# Patient Record
Sex: Female | Born: 1935 | Race: White | Hispanic: No | Smoking: Never smoker
Health system: Southern US, Community
[De-identification: ages and names within clinical notes are randomized; demographics above are authoritative.]

## PROBLEM LIST (undated history)

## (undated) DIAGNOSIS — N189 Chronic kidney disease, unspecified: Secondary | ICD-10-CM

## (undated) DIAGNOSIS — K219 Gastro-esophageal reflux disease without esophagitis: Secondary | ICD-10-CM

## (undated) DIAGNOSIS — R945 Abnormal results of liver function studies: Secondary | ICD-10-CM

## (undated) DIAGNOSIS — M109 Gout, unspecified: Secondary | ICD-10-CM

## (undated) DIAGNOSIS — E1140 Type 2 diabetes mellitus with diabetic neuropathy, unspecified: Secondary | ICD-10-CM

## (undated) DIAGNOSIS — E785 Hyperlipidemia, unspecified: Secondary | ICD-10-CM

## (undated) DIAGNOSIS — J189 Pneumonia, unspecified organism: Secondary | ICD-10-CM

## (undated) DIAGNOSIS — N390 Urinary tract infection, site not specified: Secondary | ICD-10-CM

## (undated) DIAGNOSIS — I509 Heart failure, unspecified: Secondary | ICD-10-CM

## (undated) DIAGNOSIS — M6282 Rhabdomyolysis: Secondary | ICD-10-CM

## (undated) DIAGNOSIS — E039 Hypothyroidism, unspecified: Secondary | ICD-10-CM

## (undated) DIAGNOSIS — I10 Essential (primary) hypertension: Secondary | ICD-10-CM

## (undated) HISTORY — PX: CHOLECYSTECTOMY: SHX55

---

## 2008-12-29 ENCOUNTER — Emergency Department (HOSPITAL_COMMUNITY): Admission: EM | Admit: 2008-12-29 | Discharge: 2008-12-29 | Payer: Self-pay | Admitting: Emergency Medicine

## 2009-01-14 ENCOUNTER — Ambulatory Visit: Payer: Self-pay | Admitting: Cardiology

## 2009-01-14 ENCOUNTER — Encounter (INDEPENDENT_AMBULATORY_CARE_PROVIDER_SITE_OTHER): Payer: Self-pay | Admitting: Internal Medicine

## 2009-01-14 ENCOUNTER — Inpatient Hospital Stay (HOSPITAL_COMMUNITY): Admission: EM | Admit: 2009-01-14 | Discharge: 2009-01-19 | Payer: Self-pay | Admitting: Emergency Medicine

## 2009-04-14 ENCOUNTER — Inpatient Hospital Stay (HOSPITAL_COMMUNITY): Admission: AD | Admit: 2009-04-14 | Discharge: 2009-04-18 | Payer: Self-pay

## 2009-09-24 ENCOUNTER — Ambulatory Visit (HOSPITAL_COMMUNITY): Admission: RE | Admit: 2009-09-24 | Discharge: 2009-09-24 | Payer: Self-pay | Admitting: Urology

## 2010-04-06 IMAGING — CR DG CHEST 2V
1 series · 1 of 1 positions shown · non-contrast
Comparison: 12/29/2008

CLINICAL DATA: Syncope

CHEST - 2 VIEW

[view not recorded]
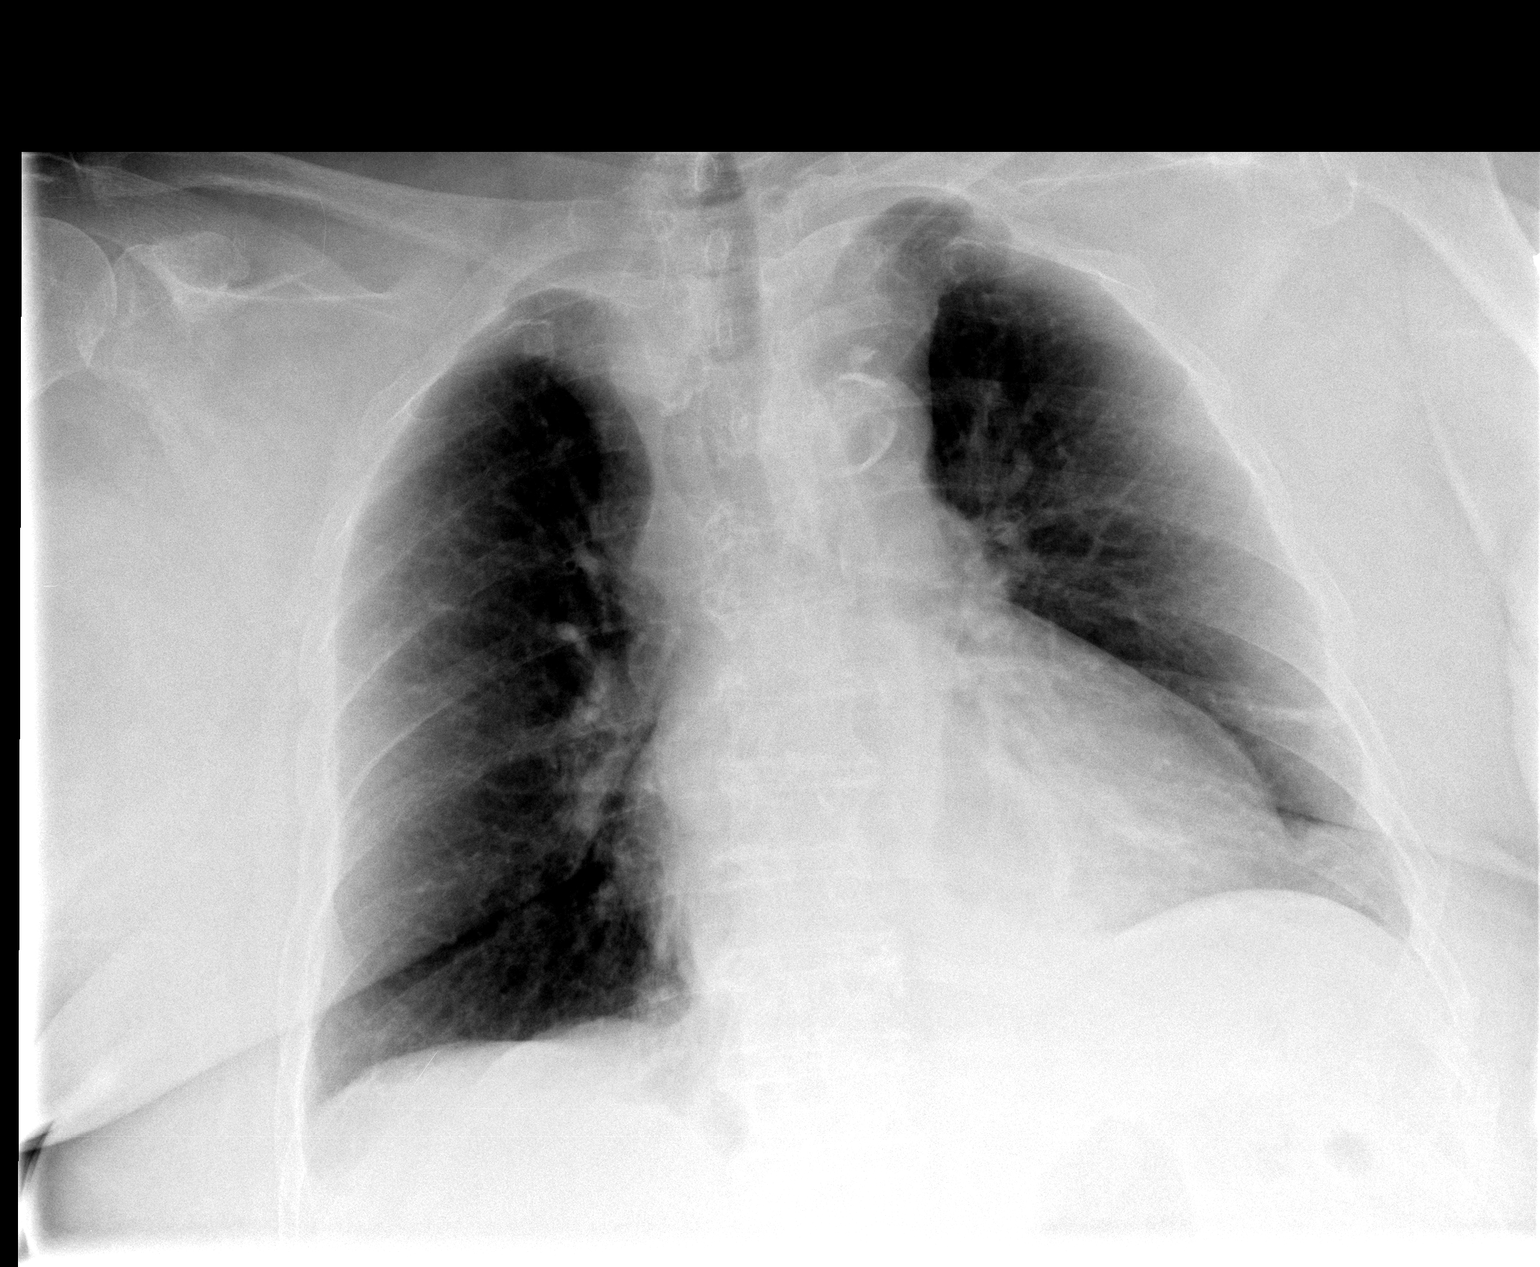

[1 of 1 positions shown; findings below may reference images not displayed]

FINDINGS: Mild cardiomegaly.  There is some patchy subsegmental
atelectasis or infiltrate in the left lung base which has slightly
increased in the retrocardiac region since the previous exam.  Mild
perihilar interstitial prominence as before.  Atheromatous aortic
arch.  No effusion. No pneumothorax.  Fracture of the lateral
aspect left seventh, eighth, and ninth ribs.
IMPRESSION: 1. Left rib fractures without pneumothorax.
2. Some increase in left lower lobe atelectasis or infiltrate.

## 2010-10-20 LAB — GLUCOSE, CAPILLARY
Glucose-Capillary: 135 mg/dL — ABNORMAL HIGH (ref 70–99)
Glucose-Capillary: 143 mg/dL — ABNORMAL HIGH (ref 70–99)
Glucose-Capillary: 152 mg/dL — ABNORMAL HIGH (ref 70–99)
Glucose-Capillary: 172 mg/dL — ABNORMAL HIGH (ref 70–99)
Glucose-Capillary: 76 mg/dL (ref 70–99)
Glucose-Capillary: 81 mg/dL (ref 70–99)

## 2010-10-20 LAB — BASIC METABOLIC PANEL
BUN: 12 mg/dL (ref 6–23)
BUN: 13 mg/dL (ref 6–23)
Calcium: 8.6 mg/dL (ref 8.4–10.5)
Chloride: 104 mEq/L (ref 96–112)
Creatinine, Ser: 1.14 mg/dL (ref 0.4–1.2)
GFR calc non Af Amer: 39 mL/min — ABNORMAL LOW (ref >60)
GFR calc non Af Amer: 46 mL/min — ABNORMAL LOW (ref >60)
Glucose, Bld: 94 mg/dL (ref 70–99)
Potassium: 4 mEq/L (ref 3.5–5.1)
Potassium: 4.2 mEq/L (ref 3.5–5.1)

## 2010-10-20 LAB — CBC
HCT: 30.1 % — ABNORMAL LOW (ref 36.0–46.0)
Hemoglobin: 10.1 g/dL — ABNORMAL LOW (ref 12.0–15.0)
MCHC: 33.5 g/dL (ref 30.0–36.0)
Platelets: 264 10*3/uL (ref 150–400)
RBC: 3.55 MIL/uL — ABNORMAL LOW (ref 3.87–5.11)
WBC: 8.3 10*3/uL (ref 4.0–10.5)

## 2010-10-20 LAB — VANCOMYCIN, TROUGH: Vancomycin Tr: 31.4 ug/mL (ref 10.0–20.0)

## 2010-10-21 LAB — URINALYSIS, ROUTINE W REFLEX MICROSCOPIC
Glucose, UA: NEGATIVE mg/dL
Leukocytes, UA: NEGATIVE
Specific Gravity, Urine: 1.005 (ref 1.005–1.030)
pH: 5.5 (ref 5.0–8.0)

## 2010-10-21 LAB — BASIC METABOLIC PANEL
BUN: 12 mg/dL (ref 6–23)
CO2: 25 mEq/L (ref 19–32)
Calcium: 9 mg/dL (ref 8.4–10.5)
Chloride: 104 mEq/L (ref 96–112)
Creatinine, Ser: 1.06 mg/dL (ref 0.4–1.2)
GFR calc Af Amer: >60 mL/min (ref >60)
GFR calc Af Amer: >60 mL/min (ref >60)
GFR calc non Af Amer: 51 mL/min — ABNORMAL LOW (ref >60)
GFR calc non Af Amer: 51 mL/min — ABNORMAL LOW (ref >60)
Potassium: 3.8 mEq/L (ref 3.5–5.1)
Sodium: 133 mEq/L — ABNORMAL LOW (ref 135–145)
Sodium: 136 mEq/L (ref 135–145)

## 2010-10-21 LAB — CBC
HCT: 29.4 % — ABNORMAL LOW (ref 36.0–46.0)
HCT: 33.9 % — ABNORMAL LOW (ref 36.0–46.0)
Hemoglobin: 11.4 g/dL — ABNORMAL LOW (ref 12.0–15.0)
Hemoglobin: 9.9 g/dL — ABNORMAL LOW (ref 12.0–15.0)
MCHC: 33.5 g/dL (ref 30.0–36.0)
MCHC: 33.8 g/dL (ref 30.0–36.0)
MCV: 85.3 fL (ref 78.0–100.0)
Platelets: 313 10*3/uL (ref 150–400)
Platelets: 356 10*3/uL (ref 150–400)
RBC: 3.45 MIL/uL — ABNORMAL LOW (ref 3.87–5.11)
RDW: 16.4 % — ABNORMAL HIGH (ref 11.5–15.5)
WBC: 10.1 10*3/uL (ref 4.0–10.5)

## 2010-10-21 LAB — URINE CULTURE: Colony Count: 25000

## 2010-10-21 LAB — DIFFERENTIAL
Basophils Absolute: 0 10*3/uL (ref 0.0–0.1)
Basophils Relative: 1 % (ref 0–1)
Basophils Relative: 1 % (ref 0–1)
Eosinophils Absolute: 0.3 10*3/uL (ref 0.0–0.7)
Eosinophils Relative: 3 % (ref 0–5)
Eosinophils Relative: 3 % (ref 0–5)
Lymphocytes Relative: 32 % (ref 12–46)
Lymphs Abs: 4.4 10*3/uL — ABNORMAL HIGH (ref 0.7–4.0)
Monocytes Absolute: 0.6 10*3/uL (ref 0.1–1.0)
Monocytes Absolute: 0.9 10*3/uL (ref 0.1–1.0)
Monocytes Relative: 9 % (ref 3–12)
Neutro Abs: 4.5 10*3/uL (ref 1.7–7.7)
Neutrophils Relative %: 44 % (ref 43–77)

## 2010-10-21 LAB — GLUCOSE, CAPILLARY
Glucose-Capillary: 147 mg/dL — ABNORMAL HIGH (ref 70–99)
Glucose-Capillary: 175 mg/dL — ABNORMAL HIGH (ref 70–99)
Glucose-Capillary: 300 mg/dL — ABNORMAL HIGH (ref 70–99)
Glucose-Capillary: 66 mg/dL — ABNORMAL LOW (ref 70–99)

## 2010-10-21 LAB — URINE MICROSCOPIC-ADD ON

## 2010-10-21 LAB — TSH: TSH: 2.446 u[IU]/mL (ref 0.350–4.500)

## 2010-10-21 LAB — HEMOGLOBIN A1C: Mean Plasma Glucose: 163 mg/dL

## 2010-10-23 LAB — FERRITIN: Ferritin: 60 ng/mL (ref 10–291)

## 2010-10-23 LAB — DIFFERENTIAL
Basophils Absolute: 0 10*3/uL (ref 0.0–0.1)
Basophils Absolute: 0 10*3/uL (ref 0.0–0.1)
Basophils Absolute: 0.1 10*3/uL (ref 0.0–0.1)
Basophils Relative: 0 % (ref 0–1)
Eosinophils Absolute: 0.1 10*3/uL (ref 0.0–0.7)
Eosinophils Absolute: 0.1 10*3/uL (ref 0.0–0.7)
Eosinophils Absolute: 0.2 10*3/uL (ref 0.0–0.7)
Eosinophils Absolute: 0.2 10*3/uL (ref 0.0–0.7)
Eosinophils Relative: 1 % (ref 0–5)
Eosinophils Relative: 1 % (ref 0–5)
Eosinophils Relative: 2 % (ref 0–5)
Eosinophils Relative: 3 % (ref 0–5)
Lymphocytes Relative: 19 % (ref 12–46)
Lymphocytes Relative: 23 % (ref 12–46)
Lymphocytes Relative: 25 % (ref 12–46)
Lymphs Abs: 1.7 10*3/uL (ref 0.7–4.0)
Lymphs Abs: 1.9 10*3/uL (ref 0.7–4.0)
Lymphs Abs: 2.1 10*3/uL (ref 0.7–4.0)
Monocytes Absolute: 0.8 10*3/uL (ref 0.1–1.0)
Monocytes Absolute: 0.9 10*3/uL (ref 0.1–1.0)
Monocytes Relative: 12 % (ref 3–12)
Monocytes Relative: 12 % (ref 3–12)
Neutrophils Relative %: 61 % (ref 43–77)
Neutrophils Relative %: 71 % (ref 43–77)

## 2010-10-23 LAB — CBC
HCT: 25.6 % — ABNORMAL LOW (ref 36.0–46.0)
HCT: 27.7 % — ABNORMAL LOW (ref 36.0–46.0)
HCT: 29 % — ABNORMAL LOW (ref 36.0–46.0)
Hemoglobin: 9 g/dL — ABNORMAL LOW (ref 12.0–15.0)
MCHC: 34.8 g/dL (ref 30.0–36.0)
MCHC: 35.3 g/dL (ref 30.0–36.0)
MCV: 92.6 fL (ref 78.0–100.0)
MCV: 92.6 fL (ref 78.0–100.0)
MCV: 92.7 fL (ref 78.0–100.0)
MCV: 92.9 fL (ref 78.0–100.0)
Platelets: 226 10*3/uL (ref 150–400)
Platelets: 237 10*3/uL (ref 150–400)
Platelets: 240 10*3/uL (ref 150–400)
RBC: 2.76 MIL/uL — ABNORMAL LOW (ref 3.87–5.11)
RBC: 2.79 MIL/uL — ABNORMAL LOW (ref 3.87–5.11)
RBC: 2.82 MIL/uL — ABNORMAL LOW (ref 3.87–5.11)
RDW: 14.3 % (ref 11.5–15.5)
RDW: 14.7 % (ref 11.5–15.5)
RDW: 14.7 % (ref 11.5–15.5)
WBC: 7 10*3/uL (ref 4.0–10.5)
WBC: 8.2 10*3/uL (ref 4.0–10.5)

## 2010-10-23 LAB — CARDIAC PANEL(CRET KIN+CKTOT+MB+TROPI)
CK, MB: 4.2 ng/mL — ABNORMAL HIGH (ref 0.3–4.0)
CK, MB: 8 ng/mL — ABNORMAL HIGH (ref 0.3–4.0)
Relative Index: 1.8 (ref 0.0–2.5)
Total CK: 175 U/L (ref 7–177)
Troponin I: 0.02 ng/mL (ref 0.00–0.06)
Troponin I: 0.03 ng/mL (ref 0.00–0.06)

## 2010-10-23 LAB — GLUCOSE, CAPILLARY
Glucose-Capillary: 102 mg/dL — ABNORMAL HIGH (ref 70–99)
Glucose-Capillary: 111 mg/dL — ABNORMAL HIGH (ref 70–99)
Glucose-Capillary: 114 mg/dL — ABNORMAL HIGH (ref 70–99)
Glucose-Capillary: 120 mg/dL — ABNORMAL HIGH (ref 70–99)
Glucose-Capillary: 121 mg/dL — ABNORMAL HIGH (ref 70–99)
Glucose-Capillary: 122 mg/dL — ABNORMAL HIGH (ref 70–99)
Glucose-Capillary: 127 mg/dL — ABNORMAL HIGH (ref 70–99)
Glucose-Capillary: 130 mg/dL — ABNORMAL HIGH (ref 70–99)
Glucose-Capillary: 160 mg/dL — ABNORMAL HIGH (ref 70–99)
Glucose-Capillary: 29 mg/dL — CL (ref 70–99)
Glucose-Capillary: 35 mg/dL — CL (ref 70–99)
Glucose-Capillary: 60 mg/dL — ABNORMAL LOW (ref 70–99)
Glucose-Capillary: 67 mg/dL — ABNORMAL LOW (ref 70–99)
Glucose-Capillary: 69 mg/dL — ABNORMAL LOW (ref 70–99)

## 2010-10-23 LAB — COMPREHENSIVE METABOLIC PANEL
ALT: 20 U/L (ref 0–35)
AST: 28 U/L (ref 0–37)
AST: 29 U/L (ref 0–37)
AST: 32 U/L (ref 0–37)
Albumin: 3 g/dL — ABNORMAL LOW (ref 3.5–5.2)
Albumin: 3 g/dL — ABNORMAL LOW (ref 3.5–5.2)
CO2: 25 mEq/L (ref 19–32)
CO2: 25 mEq/L (ref 19–32)
Calcium: 8.5 mg/dL (ref 8.4–10.5)
Chloride: 103 mEq/L (ref 96–112)
Chloride: 105 mEq/L (ref 96–112)
Creatinine, Ser: 1.63 mg/dL — ABNORMAL HIGH (ref 0.4–1.2)
Creatinine, Ser: 2.02 mg/dL — ABNORMAL HIGH (ref 0.4–1.2)
GFR calc Af Amer: 29 mL/min — ABNORMAL LOW (ref >60)
GFR calc Af Amer: 29 mL/min — ABNORMAL LOW (ref >60)
GFR calc Af Amer: 37 mL/min — ABNORMAL LOW (ref >60)
GFR calc non Af Amer: 24 mL/min — ABNORMAL LOW (ref >60)
GFR calc non Af Amer: 31 mL/min — ABNORMAL LOW (ref >60)
Glucose, Bld: 111 mg/dL — ABNORMAL HIGH (ref 70–99)
Potassium: 4.6 mEq/L (ref 3.5–5.1)
Sodium: 136 mEq/L (ref 135–145)
Total Bilirubin: 0.3 mg/dL (ref 0.3–1.2)
Total Bilirubin: 0.3 mg/dL (ref 0.3–1.2)
Total Protein: 5.9 g/dL — ABNORMAL LOW (ref 6.0–8.3)
Total Protein: 6.3 g/dL (ref 6.0–8.3)

## 2010-10-23 LAB — BASIC METABOLIC PANEL
BUN: 50 mg/dL — ABNORMAL HIGH (ref 6–23)
CO2: 24 mEq/L (ref 19–32)
CO2: 25 mEq/L (ref 19–32)
Calcium: 9.1 mg/dL (ref 8.4–10.5)
Chloride: 102 mEq/L (ref 96–112)
Creatinine, Ser: 1.58 mg/dL — ABNORMAL HIGH (ref 0.4–1.2)
Creatinine, Ser: 1.77 mg/dL — ABNORMAL HIGH (ref 0.4–1.2)
GFR calc Af Amer: 31 mL/min — ABNORMAL LOW (ref >60)
GFR calc Af Amer: 39 mL/min — ABNORMAL LOW (ref >60)
GFR calc non Af Amer: 28 mL/min — ABNORMAL LOW (ref >60)
Glucose, Bld: 90 mg/dL (ref 70–99)
Potassium: 4.5 mEq/L (ref 3.5–5.1)
Potassium: 4.5 mEq/L (ref 3.5–5.1)

## 2010-10-23 LAB — URINE MICROSCOPIC-ADD ON

## 2010-10-23 LAB — URINALYSIS, ROUTINE W REFLEX MICROSCOPIC
Glucose, UA: NEGATIVE mg/dL
Glucose, UA: NEGATIVE mg/dL
Protein, ur: NEGATIVE mg/dL
Urobilinogen, UA: 0.2 mg/dL (ref 0.0–1.0)
pH: 5 (ref 5.0–8.0)

## 2010-10-23 LAB — LIPID PANEL
Cholesterol: 152 mg/dL (ref 0–200)
HDL: 37 mg/dL — ABNORMAL LOW (ref >39)

## 2010-10-23 LAB — BRAIN NATRIURETIC PEPTIDE: Pro B Natriuretic peptide (BNP): 303 pg/mL — ABNORMAL HIGH (ref 0.0–100.0)

## 2010-10-23 LAB — HEMOGLOBIN A1C
Hgb A1c MFr Bld: 6.4 % — ABNORMAL HIGH (ref 4.6–6.1)
Mean Plasma Glucose: 140 mg/dL

## 2010-10-23 LAB — IRON AND TIBC
Iron: 78 ug/dL (ref 42–135)
UIBC: 175 ug/dL
UIBC: 211 ug/dL

## 2010-10-23 LAB — TSH: TSH: 4.107 u[IU]/mL (ref 0.350–4.500)

## 2010-10-23 LAB — PROTIME-INR
INR: 1.1 (ref 0.00–1.49)
Prothrombin Time: 14.4 seconds (ref 11.6–15.2)

## 2010-10-23 LAB — MAGNESIUM: Magnesium: 2.2 mg/dL (ref 1.5–2.5)

## 2010-10-23 LAB — CK: Total CK: 690 U/L — ABNORMAL HIGH (ref 7–177)

## 2010-10-23 LAB — PHOSPHORUS: Phosphorus: 4.2 mg/dL (ref 2.3–4.6)

## 2010-10-23 LAB — SEDIMENTATION RATE: Sed Rate: 78 mm/hr — ABNORMAL HIGH (ref 0–22)

## 2010-10-23 LAB — TROPONIN I: Troponin I: 0.14 ng/mL — ABNORMAL HIGH (ref 0.00–0.06)

## 2010-10-24 LAB — URINE MICROSCOPIC-ADD ON

## 2010-10-24 LAB — DIFFERENTIAL
Basophils Relative: 0 % (ref 0–1)
Basophils Relative: 0 % (ref 0–1)
Eosinophils Absolute: 0 10*3/uL (ref 0.0–0.7)
Eosinophils Relative: 0 % (ref 0–5)
Lymphs Abs: 1.3 10*3/uL (ref 0.7–4.0)
Monocytes Absolute: 0.9 10*3/uL (ref 0.1–1.0)
Monocytes Absolute: 0.8 10*3/uL (ref 0.1–1.0)
Monocytes Relative: 8 % (ref 3–12)
Monocytes Relative: 8 % (ref 3–12)
Neutro Abs: 8.1 10*3/uL — ABNORMAL HIGH (ref 1.7–7.7)

## 2010-10-24 LAB — CBC
MCHC: 34.1 g/dL (ref 30.0–36.0)
MCHC: 35 g/dL (ref 30.0–36.0)
MCV: 93.1 fL (ref 78.0–100.0)
Platelets: 259 10*3/uL (ref 150–400)
Platelets: 204 10*3/uL (ref 150–400)
RDW: 14.5 % (ref 11.5–15.5)
RDW: 14.4 % (ref 11.5–15.5)
WBC: 11.6 10*3/uL — ABNORMAL HIGH (ref 4.0–10.5)

## 2010-10-24 LAB — URINALYSIS, ROUTINE W REFLEX MICROSCOPIC
Bilirubin Urine: NEGATIVE
Glucose, UA: NEGATIVE mg/dL
Glucose, UA: NEGATIVE mg/dL
Ketones, ur: NEGATIVE mg/dL
Specific Gravity, Urine: 1.025 (ref 1.005–1.030)
Urobilinogen, UA: 0.2 mg/dL (ref 0.0–1.0)
pH: 5.5 (ref 5.0–8.0)

## 2010-10-24 LAB — GLUCOSE, CAPILLARY

## 2010-10-24 LAB — COMPREHENSIVE METABOLIC PANEL
ALT: 19 U/L (ref 0–35)
Albumin: 3.7 g/dL (ref 3.5–5.2)
Alkaline Phosphatase: 22 U/L — ABNORMAL LOW (ref 39–117)
BUN: 44 mg/dL — ABNORMAL HIGH (ref 6–23)
Calcium: 9.7 mg/dL (ref 8.4–10.5)
Potassium: 4.4 mEq/L (ref 3.5–5.1)
Sodium: 136 mEq/L (ref 135–145)
Total Protein: 7.3 g/dL (ref 6.0–8.3)

## 2010-10-24 LAB — URINE CULTURE

## 2010-10-24 LAB — BASIC METABOLIC PANEL
BUN: 54 mg/dL — ABNORMAL HIGH (ref 6–23)
Chloride: 99 mEq/L (ref 96–112)
Creatinine, Ser: 2.08 mg/dL — ABNORMAL HIGH (ref 0.4–1.2)

## 2010-10-24 LAB — CK TOTAL AND CKMB (NOT AT ARMC): Total CK: 164 U/L (ref 7–177)

## 2010-11-29 NOTE — Consult Note (Signed)
NAME:  Julia Maynard, Julia Maynard NO.:  [id number]   MEDICAL RECORD NO.:  [id number]          PATIENT TYPE:  INP   LOCATION:  A326                          FACILITY:  [facility]   PHYSICIAN:  Jorja Loa, M.D.DATE OF BIRTH:  [birth year]   DATE OF CONSULTATION:  DATE OF DISCHARGE:                                 CONSULTATION   REASON FOR CONSULT:  Renal insufficiency.   NAME OF ADMITTING PHYSICIAN:  Skeet Latch, DO   HISTORY OF PRESENT ILLNESS:  Ms. Muhlenkamp is a 77 year old female with past  medical history of diabetes for more than 20 years, presently was  brought by family after she was found on the floor with altered mental  status.  When blood work was done, she was found to have hypoglycemia,  hence admitted for hypoglycemic episodes.  Presently, the patient denies  any history of diabetic retinopathy, no neuropathy, no history of  proteinuria.  However, according to her daughter about a year ago when  they checked her blood work, she was found to have a problem with a  kidneys, but since then she did not have any additional problem.  The  patient has intermittent swelling of the legs.  She did not have any  nausea, no vomiting.  She denies any previous history of kidney stone.   PAST MEDICAL HISTORY:  As stated above.  The patient has history of  diabetes for more than 20 years.  She has been on insulin.  She has also  history of gout, history of hypertension, history of hypothyroidism,  history of hyperlipidemia, history of osteoporosis.  She has  occasionally urinary tract infection.  She has history of obesity.   PAST SURGICAL HISTORY:  She has history of cystectomy about 25 years  ago.   MEDICATIONS:  Her medications consist of:  1. Ventolin inhaler 2.5 q.4 h.  2. Aspirin 81 mg daily.  3. Rocephin 2 g IV q.24 h.  4. She is also getting Novolin insulin and Lantus insulin 18 units at      bedtime.  5. Atrovent inhaler 0.5 q.6 h.  6. She is getting also  Protonix 40 mg p.o. daily.  7. IV fluids normal saline at 80 mL/h.   Other medications are p.r.n. medications.   ALLERGIES:  She is allergic to LOTENSIN.   SOCIAL HISTORY:  No history of smoking.  No history of alcohol abuse.  No history of, also, illicit drug use.   FAMILY HISTORY:  She states that she has a family member who is on  dialysis.  Her daughters have diabetes but no renal failure.   REVIEW OF SYSTEMS:  Overall, at this moment she is feeling good.  She  does not have any shortness of breath.  No dizziness.  No  lightheadedness.  She denies any chest pain.  She also denies any  diarrhea.  No fever, chills, or sweating.  As stated above, she has  intermittent swelling of her legs.   PHYSICAL EXAMINATION:  GENERAL:  The patient is alert, does not seem to  be in any apparent distress.  VITAL SIGNS:  Her temperature is 96.9, pulse of 56, blood pressure  115/57.  HEENT:  No conjunctival pallor.  No icterus.  Oral mucosa seems to be  dry.  NECK:  Supple.  No JVD.  CHEST:  Clear to auscultation.  HEART:  Regular rate and rhythm.  No murmur.  No S3.  ABDOMEN:  Soft.  Positive bowel sounds.  EXTREMITIES:  She has 1+ edema.   LABORATORY DATA:  Her white blood cell count is 9.5, hemoglobin 10.2,  hematocrit 29, platelets of 240.  Sodium 134, potassium 4.5, BUN is 50,  and creatinine is 1.77.  When she came, her BUN was 108.  Her blood  work, which was done June 15, about 2 weeks ago, creatinine was 1.87.  Prior to that, there is no further blood work for comparison.  Her  phosphorous is 4.2.  CPK 148, MB fraction is 4.2, relative index is 2.8,  and BNP is 303.  Her UA from yesterday specific gravity is 1.025, pH of  5; she has blood large, protein trace, nitrite positive, leukocytes  moderate, bacteria a few, and red blood cells 21-50.   ASSESSMENT:  1. Renal insufficiency.  At this moment, not clear whether this is      acute on chronic.  According to her daughter, the  patient seems to      have elevated BUN and creatinine for some time, at least for about      a year.  Hence, probably, we may be dealing with acute-on-chronic      renal failure.  Even though the patient has history of diabetes, at      this moment she denies any diabetic retinopathy and also no      neuropathy.  Her UA, she has trace protein with no significant      proteinuria, even so difficult to rule out possibility of diabetic      nephropathy.  At this moment, other etiologies also need to be      included in the differential.  Since the patient has high urine      specific gravity at this moment, superimposed prerenal syndrome      also needs to be entertained.  2. History of gout.  She is asymptomatic.  She is not on any      medication, but she has been on and off on other medication,      nonsteroidal.  At this moment, they do not remember what the      medications were.  3. History of hypertension.  Blood pressure seems to be controlled      reasonably good.  4. History of hypothyroidism.  5. History of osteoporosis.  6. History of hypercholesterolemia.  She is on simvastatin.  7. History of diabetes.  She is on insulin and hypoglycemic agent.      She has occasional hypoglycemic episode.  8. History of urinary tract infection when she was seen in June 15,      during that time also she had similar episode.  During that time,      she was treated with Macrobid for 10 days.  Presently, her urine      seems to be still infected with nitrite positive, some bacteria,      and also leukocyturia.  Since the chest x-ray also showed      questionable atelectasis versus infiltrate, the patient presently      is on antibiotics.  She is afebrile.  Her white blood cell count      seems to be becoming normal.  9. History of proteinuria, probably coming from her hematuria; however      since she is a diabetic patient, the proteinuria could be also      related to that.  10.History of  obesity.  11.History of mild leg swelling.  At this moment probably not renal.      The patient has been on calcium channel blocker as an outpatient,      probably that might have contributed.   RECOMMENDATIONS:  We will check again her UA.  We will do ultrasound of  the kidneys to evaluate kidney size, and also we will check ANA  complement, hepatitis B surface antigen.  We will follow the patient.      Jorja Loa, M.D.  Electronically Signed     BB/MEDQ  D:  01/14/2009  T:  01/15/2009  Job:  [id number]

## 2010-11-29 NOTE — H&P (Signed)
NAME:  Julia Maynard, Julia Maynard NO.:  [id number]   MEDICAL RECORD NO.:  [id number]          PATIENT TYPE:  INP   LOCATION:  A326                          FACILITY:  [facility]   PHYSICIAN:  Skeet Latch, DO    DATE OF BIRTH:  23-Feb-1932   DATE OF ADMISSION:  01/14/2009  DATE OF DISCHARGE:  LH                              HISTORY & PHYSICAL   CHIEF COMPLAINT:  Syncopal episode and hypoglycemic episode.   HISTORY OF PRESENT ILLNESS:  This is a 77 year old Caucasian female who  presents with family members after apparent syncopal episode. Family  members state that the patient talked to her daughter who states that  the patient was mumbling.  She went to visit her mother.  She was found  on the floor but conscious.  The patient was found to be hypoglycemic  and was given IV glucose.  Family members state the patient was in the  ER.  He __________for some episode after a fall and sustained left rib  fractures.  At that time, the patient had high blood sugar.  The patient  was treated in the emergency room for urinary tract infection, left rib  fracture and hypoglycemia, placed on antibiotics and told to follow up  with her primary care physician.  On this visit, the patient again fell,  and the patient does not remember having episodes of falling.  Family  members state that the patient is usually independent, lives alone, but  they visit her regularly.  The patient has been complaining of right  lower leg pain, and family members state that it was vascular studies,  and they state that no blood clot was found.  The patient continues to  complain of right leg pain.   PAST MEDICAL HISTORY:  1. Insulin-dependent diabetes.  2. Hypertension.  3. Gout.  4. Hyperlipidemia.  5. Hypothyroidism.  6. Osteoporosis.   PAST SURGICAL HISTORY:  Cholecystectomy.   SOCIAL HISTORY:  No smoking, alcohol, illicit drug use.   ALLERGIES:  Lotensin.   HOME MEDICATIONS:  1. Avandia 4 mg  b.i.d.  2. Lovastatin 80 mg at bedtime.  3. Metoprolol 50 mg b.i.d.  4. Amlodipine 5 mg once daily.  5. Losartan 100 mg once daily.  6. Indapamide 2.5 mg once daily.  7. Levothyroxine 50 mcg once daily.  8. Lasix 60 units subcu at bedtime.   PHYSICAL EXAMINATION:  VITAL SIGNS:  Temperature 96.8, pulse 63,  respirations 18, blood pressure 131/81, saturating 99% on 2 liters.  CONSTITUTIONAL:  She is well-nourished, well-developed, well-hydrated.  HEENT:  Normocephalic, atraumatic.  Eyes PERRLA.  EOMI.  NECK:  Soft, supple, nontender, nondistended.  LUNGS:  There are some acute breath sounds bilaterally.  No rhonchi,  rales or wheezes noted.  CHEST:  There is some tenderness, left chest wall on deep palpation.  CARDIAC:  Regular rate and rhythm.  No murmurs, rubs or gallops.  ABDOMEN:  Soft.  Nontender.  Noncontributory.  Positive bowel sounds.  No rigidity or guarding.  NEUROLOGICAL:  Cranial nerves II-XII grossly intact.  Patient alert and  oriented  x3.  EXTREMITIES:  No cyanosis, clubbing.  The patient does have trace to 1+  edema bilateral lower extremities.  No rashes appreciated.   LABORATORY DATA:  Urinalysis:  Hazy, large amount of blood, trace  protein.  Positive for nitrates, moderate leukocytes.  Troponin 0.01.  Total creatinine kinase 164, CK MB 4.7, relative index 2.9.  Sodium 134,  potassium 4.7, chloride 99, CO2 27, glucose 258, BUN 54, creatinine  2.08.  White count less than 6, hemoglobin 10.6, hematocrit 31.2,  platelet count 259,000.  Hip x-ray was negative for fracture and shows  some moderate degenerative changes.  Chest x-ray showed:  1. Left rib fractures without pneumothorax.  2. Increased in left lower lobe atelectasis or infiltrate.  3. Knee x-ray negative for fracture or other acute injury.  Showed      condylar canal stenosis suggesting CPPD.  CT of her head showed no evidence of acute intracranial abnormality,  showed atrophy and chronic small vessel  white matter ischemic changes  and chronic left sphenoid sinus disease.  CT of the cervical spine  showed no static evidence of acute injury.  The cervical spine showed  some mild multilevel degenerative changes.   IMPRESSION:  This is a 77 year old Caucasian female who presents with  syncope and hypoglycemic episode.  The patient has been having falls  over the last few weeks she is unaware of.  The patient has been having  some episodes of hyper and hypoglycemia.  The patient has had chronic  urinary tract infection as well as she sustained left rib fractures from  a fall.   PLAN:  1. The patient was admitted to the service of [facility] to      telemetry bed.  2. Syncope.  She did have CT of her head which was unremarkable.  We      will get some carotid Doppler studies at this time, also will add a      2D echocardiogram.  3. For renal insufficiency, seems to be acute on chronic.  Family      members state the patient has not seen any nephrologists.  We will      get a nephrology consult and will hold her ACE and ARB at this      time, and will get a renal ultrasound also.  4. Urinary tract infection.  The patient will get a culture and      sensitivities as well as be placed on IV antibiotics.  5. For left rib fractures, the patient will be placed on oral pain      medications.  Will encourage incentive spirometry also.  6. Possible left lower atelectasis or infiltrate.  Again, the patient      will be on IV antibiotics and will encourage incentive spirometry      at bedside at this time.  The patient will probably need a repeat      chest x-ray in the next few days.  7. For her diabetes, the patient will be placed on sliding scale.  For      blood sugars, check a.c. and h.s.  Also, will      place her on Lantus.  The patient states that she takes 16 units of      subcu Lantus.  Will increase this to 18 units.  The patient      probably needs diabetes instructions.  8.  The patient will be on GI as well as DVT prophylaxis.  Skeet Latch, DO     SM/MEDQ  D:  01/14/2009  T:  01/14/2009  Job:  [id number]

## 2010-11-29 NOTE — Discharge Summary (Signed)
NAME:  Julia Maynard, Julia Maynard NO.:  [id number]   MEDICAL RECORD NO.:  [id number]          PATIENT TYPE:  INP   LOCATION:  A326                          FACILITY:  [facility]   PHYSICIAN:  Renee Ramus, MD       DATE OF BIRTH:  [birth year]   DATE OF ADMISSION:  01/13/2009  DATE OF DISCHARGE:  LH                               DISCHARGE SUMMARY   PRIMARY DISCHARGE DIAGNOSIS:  Lower extremity cellulitis.   SECONDARY DIAGNOSES:  1. ?pulmonary embolus.  2. Diabetes mellitus type 2.    Dictation ended at this point.      Renee Ramus, MD     JF/MEDQ  D:  01/15/2009  T:  01/16/2009  Job:  [id number]   cc:   Edwena Felty. Romine, M.D.  Fax: [phone]

## 2010-11-29 NOTE — Discharge Summary (Signed)
NAME:  Julia Maynard, Julia Maynard NO.:  [id number]   MEDICAL RECORD NO.:  [id number]          PATIENT TYPE:  INP   LOCATION:  A326                          FACILITY:  [facility]   PHYSICIAN:  Renee Ramus, MD       DATE OF BIRTH:  11-06-1931   DATE OF ADMISSION:  01/13/2009  DATE OF DISCHARGE:  LH                               DISCHARGE SUMMARY   PRIMARY DISCHARGE DIAGNOSES:  Syncopal episode with hypoglycemia.   SECONDARY DIAGNOSES:  1. Hypoglycemia.  2. Hypertension.  3. Gout.  4. Hyperlipidemia.  5. Hypothyroidism.  6. Osteoporosis.  7. Obesity.  8. Chronic right leg pain.  9. Iron deficiency anemia.   HOSPITAL COURSE BY PROBLEM:  1. Syncopal episode.  The patient is a 77 year old female who was      admitted after a syncopal episode.  The patient was found to be in      first-degree AV block.  She was also dehydrated and hypoglycemic.      The patient has had all of her medications discontinued.  We were      actually able to continue however, her metoprolol without any ill      effects.  The patient however, does not tolerate her ACE      inhibitors.  The patient is now stable.  Her blood pressure is      stable and she is being discharged to home with a greatly reduced      medication list.  2. Hypoglycemia with previous diagnosis of type 1 diabetes mellitus.      The patient has not required any insulin or diabetic medications.      While it is true that her blood glucoses ran between 102 and 127      during this hospital stay it is thought that her condition does not      warrant this type of aggressive treatment with insulin medications.      We are going to continue to hold her medications and allow her      primary care physician to make adjustments with regards to dosing      and type of medications that she may require to control her glucose  3. Urinary tract infection.  The patient did have evidence of urinary      tract infection.  She was treated with  broad-spectrum antibiotics.      This is now resolved and she does not require additional treatment.  4. Acute renal failure on chronic renal insufficiency.  The patient      came in with creatinine of 1.67 and it rose to 2.02 and is now      decreased to 1.58.  I am unsure how much of this is a contribution      of her diuretic usage.  We have stopped her diuretics at discharge      and we will not give her any further.  She may actually improve her      creatinine additionally, if she is not being diuresed.  We will  have to defer to her primary care physician to follow up with her      creatinine  5. Hypertension as above.  The patient is relatively well-controlled      on her current regimen.  6. Chronic right leg pain.  The patient will be given pain medications      for this.  7. Hyperlipidemia.  The patient will continue statin therapy.  8. Hypothyroid.  The patient was found to be somewhat subtherapeutic      and her thyroid dosage has been increased prior to discharge.  She      is to follow up with her primary care physician with regards to      follow up TSH and free T4 adjustment of levothyroxine dosage.  9. Iron deficiency anemia.  The patient was found to be iron      deficient.  We have started her on supplemental iron tablets.   LABS OF NOTE:  1. Initial leukocytosis white count of 11.6.  This is now decreased to      7.  2. Anemia.  Hemoglobin of 10.6 and hematocrit 31.2.  3. Blood glucose ranging between 99 and 127, but early in her      admission her blood glucose fell to the 30s-40s.  This was likely      secondary to a Lantus usage.  4. Renal insufficiency with initial BUN at 54, creatinine 2.08.  This      is now decreased to BUN of 36 and creatinine of 1.63.  5. Pyelonephritis with initially the patient having positive nitrite,      a large number of leukocytes, large number of bacteria.  Subsequent      urine has shown her to be continuing to have  hematuria, but no      evidence of infection.   STUDIES:  1. Ultrasound of the kidneys showing atrophic kidneys consistent with      medical renal disease, but no evidence of mass or hydronephrosis.  2. Plain film of the right hip showing no fracture, but degenerative      changes were present.  3. Chest x-ray showing left rib fractures from previous fall and some      left lower lobe atelectasis.  4. A 4-view of the knee showing no fracture or acute bony injury.  5. CT head showing no acute injury to cervical spine, but multiple      level of osteoarthritis present and no evidence of acute      intracranial abnormality.   MEDICATIONS ON DISCHARGE:  1. Losartan and which we are discontinuing.  2. Amlodipine 5 mg p.o. daily.  3. Indapamide which we are discontinuing.  4. Lovastatin 80 mg p.o. nightly.  5. Levothyroxine which are increasing to 75 mcg p.o. daily.  6. Metoprolol 50 mg p.o. b.i.d.  7. Avandia which we are discontinuing.  8. Lantus 16 units subcutaneous nightly which we are discontinuing.  9. Nu-Iron 150 mg p.o. daily.   No labs or studies pending at time of discharge.   The patient is in stable condition and anxious for discharge.   Time spent: 35 minutes.      Renee Ramus, MD  Electronically Signed     JF/MEDQ  D:  01/19/2009  T:  01/19/2009  Job:  [id number]

## 2011-11-06 ENCOUNTER — Encounter (HOSPITAL_COMMUNITY): Payer: Self-pay | Admitting: *Deleted

## 2011-11-06 ENCOUNTER — Emergency Department (HOSPITAL_COMMUNITY): Payer: Medicare Other

## 2011-11-06 ENCOUNTER — Inpatient Hospital Stay (HOSPITAL_COMMUNITY)
Admission: EM | Admit: 2011-11-06 | Discharge: 2011-11-10 | DRG: 558 | Disposition: A | Discharge status: Skilled Nursing Facility | Payer: Medicare Other | Attending: Internal Medicine | Admitting: Internal Medicine

## 2011-11-06 DIAGNOSIS — M6282 Rhabdomyolysis: Principal | ICD-10-CM | POA: Diagnosis present

## 2011-11-06 DIAGNOSIS — K219 Gastro-esophageal reflux disease without esophagitis: Secondary | ICD-10-CM | POA: Diagnosis present

## 2011-11-06 DIAGNOSIS — R5381 Other malaise: Secondary | ICD-10-CM | POA: Diagnosis present

## 2011-11-06 DIAGNOSIS — M109 Gout, unspecified: Secondary | ICD-10-CM | POA: Diagnosis present

## 2011-11-06 DIAGNOSIS — I10 Essential (primary) hypertension: Secondary | ICD-10-CM | POA: Diagnosis present

## 2011-11-06 DIAGNOSIS — IMO0001 Reserved for inherently not codable concepts without codable children: Secondary | ICD-10-CM | POA: Diagnosis present

## 2011-11-06 DIAGNOSIS — E039 Hypothyroidism, unspecified: Secondary | ICD-10-CM | POA: Diagnosis present

## 2011-11-06 DIAGNOSIS — E119 Type 2 diabetes mellitus without complications: Secondary | ICD-10-CM | POA: Diagnosis present

## 2011-11-06 DIAGNOSIS — E785 Hyperlipidemia, unspecified: Secondary | ICD-10-CM | POA: Diagnosis present

## 2011-11-06 DIAGNOSIS — E871 Hypo-osmolality and hyponatremia: Secondary | ICD-10-CM | POA: Diagnosis present

## 2011-11-06 DIAGNOSIS — M79609 Pain in unspecified limb: Secondary | ICD-10-CM | POA: Diagnosis present

## 2011-11-06 DIAGNOSIS — R5383 Other fatigue: Secondary | ICD-10-CM | POA: Diagnosis present

## 2011-11-06 DIAGNOSIS — R531 Weakness: Secondary | ICD-10-CM

## 2011-11-06 HISTORY — DX: Urinary tract infection, site not specified: N39.0

## 2011-11-06 HISTORY — DX: Heart failure, unspecified: I50.9

## 2011-11-06 HISTORY — DX: Essential (primary) hypertension: I10

## 2011-11-06 NOTE — ED Notes (Signed)
Bil leg weakness for 1 week, worse today. Had x-rays done today and told she has arthritis.

## 2011-11-06 NOTE — ED Provider Notes (Signed)
History   This chart was scribed for Julia Lennert, MD by Clarita Crane. The patient was seen in room APA14/AP[facility]14. Patient's care was started at 2217.    CSN: [id number]  Arrival date & time 11/06/11  2217   First MD Initiated Contact with Patient 11/06/11 2330      Chief Complaint  Patient presents with  . Weakness    (Consider location/radiation/quality/duration/timing/severity/associated sxs/prior treatment) HPI Julia Maynard is a 78 y.o. female who presents to the Emergency Department complaining of constant moderate weakness of bilateral lower extremities, right worse than left, with associated mild back pain described as a "push" onset 1 week ago and gradually worsening since. Patient notes she was evaluated by PCP earlier today for same complaint and was told symptoms may be result of arthritis. Denies incontinence, HA, nausea, vomiting, chest pain, SOB. Patient with h/o HTN, renal disorder, diabetes, UTI, CHF, thyroid disease.    Past Medical History  Diagnosis Date  . Hypertension   . Renal disorder   . Diabetes mellitus   . Urinary tract infection   . CHF (congestive heart failure)   . Thyroid disease   . Reflux   . Cataract     Past Surgical History  Procedure Date  . Cholecystectomy     History reviewed. No pertinent family history.  History  Substance Use Topics  . Smoking status: Never Smoker   . Smokeless tobacco: Current User    Types: Snuff  . Alcohol Use: No    OB History    Grav Para Term Preterm Abortions TAB SAB Ect Mult Living                  Review of Systems  Constitutional: Negative for fever.  HENT: Negative for rhinorrhea.   Eyes: Negative for pain.  Respiratory: Negative for cough and shortness of breath.   Cardiovascular: Negative for chest pain.  Gastrointestinal: Negative for nausea, vomiting, abdominal pain and diarrhea.  Genitourinary: Negative for dysuria.  Musculoskeletal: Negative for back pain.  Skin: Negative for  rash.  Neurological: Positive for weakness (bilateral lower extremities). Negative for headaches.    Allergies  Ace inhibitors; Angiotensin receptor blockers; Lasamide; Nitrofuran derivatives; and Sulfa antibiotics  Home Medications  No current outpatient prescriptions on file.  BP 153/134  Pulse 75  Temp(Src) 98.1 F (36.7 C) (Oral)  Resp 16  Ht 5\' 2"  (1.575 m)  Wt 185 lb (83.915 kg)  BMI 33.84 kg/m2  SpO2 100%  Physical Exam  Nursing note and vitals reviewed. Constitutional: She is oriented to person, place, and time. She appears well-developed and well-nourished. No distress.  HENT:  Head: Normocephalic and atraumatic.  Eyes: EOM are normal. Pupils are equal, round, and reactive to light.  Neck: Neck supple. No tracheal deviation present.  Cardiovascular: Normal rate.   Pulmonary/Chest: Effort normal. No respiratory distress.  Abdominal: Soft. She exhibits no distension. There is no tenderness.  Musculoskeletal: Normal range of motion. She exhibits no edema.  Neurological: She is alert and oriented to person, place, and time. No sensory deficit.       Distal sensation intact. Moderate to severe weakness of bilateral lower extremities.   Skin: Skin is warm and dry.  Psychiatric: She has a normal mood and affect. Her behavior is normal.    ED Course  Procedures (including critical care time)  DIAGNOSTIC STUDIES: Oxygen Saturation is 100% on room air, normal by my interpretation.    COORDINATION OF CARE: 11:40PM- Patient informed of current  plan for treatment and evaluation and agrees with plan at this time.     Labs Reviewed - No data to display No results found.   No diagnosis found.    MDM       The chart was scribed for me under my direct supervision.  I personally performed the history, physical, and medical decision making and all procedures in the evaluation of this patient.Julia Lennert, MD 11/07/11 [phone]

## 2011-11-07 ENCOUNTER — Encounter (HOSPITAL_COMMUNITY): Payer: Self-pay | Admitting: Internal Medicine

## 2011-11-07 ENCOUNTER — Inpatient Hospital Stay (HOSPITAL_COMMUNITY): Payer: Medicare Other

## 2011-11-07 DIAGNOSIS — E039 Hypothyroidism, unspecified: Secondary | ICD-10-CM | POA: Diagnosis present

## 2011-11-07 DIAGNOSIS — K219 Gastro-esophageal reflux disease without esophagitis: Secondary | ICD-10-CM | POA: Diagnosis present

## 2011-11-07 DIAGNOSIS — E119 Type 2 diabetes mellitus without complications: Secondary | ICD-10-CM | POA: Diagnosis present

## 2011-11-07 DIAGNOSIS — I10 Essential (primary) hypertension: Secondary | ICD-10-CM | POA: Diagnosis present

## 2011-11-07 DIAGNOSIS — E785 Hyperlipidemia, unspecified: Secondary | ICD-10-CM | POA: Diagnosis present

## 2011-11-07 DIAGNOSIS — M109 Gout, unspecified: Secondary | ICD-10-CM | POA: Diagnosis present

## 2011-11-07 DIAGNOSIS — M6282 Rhabdomyolysis: Principal | ICD-10-CM | POA: Diagnosis present

## 2011-11-07 DIAGNOSIS — E871 Hypo-osmolality and hyponatremia: Secondary | ICD-10-CM | POA: Diagnosis present

## 2011-11-07 LAB — COMPREHENSIVE METABOLIC PANEL
ALT: 105 U/L — ABNORMAL HIGH (ref 0–35)
AST: 105 U/L — ABNORMAL HIGH (ref 0–37)
Alkaline Phosphatase: 36 U/L — ABNORMAL LOW (ref 39–117)
CO2: 24 mEq/L (ref 19–32)
Calcium: 10 mg/dL (ref 8.4–10.5)
GFR calc Af Amer: 54 mL/min — ABNORMAL LOW (ref >90)
GFR calc non Af Amer: 47 mL/min — ABNORMAL LOW (ref >90)
Glucose, Bld: 244 mg/dL — ABNORMAL HIGH (ref 70–99)
Potassium: 4.1 mEq/L (ref 3.5–5.1)
Sodium: 126 mEq/L — ABNORMAL LOW (ref 135–145)

## 2011-11-07 LAB — GLUCOSE, CAPILLARY
Glucose-Capillary: 101 mg/dL — ABNORMAL HIGH (ref 70–99)
Glucose-Capillary: 212 mg/dL — ABNORMAL HIGH (ref 70–99)

## 2011-11-07 LAB — URINALYSIS, ROUTINE W REFLEX MICROSCOPIC
Bilirubin Urine: NEGATIVE
Ketones, ur: NEGATIVE mg/dL
Specific Gravity, Urine: 1.015 (ref 1.005–1.030)
Urobilinogen, UA: 0.2 mg/dL (ref 0.0–1.0)
pH: 6 (ref 5.0–8.0)

## 2011-11-07 LAB — CBC
MCH: 29.7 pg (ref 26.0–34.0)
MCHC: 34.2 g/dL (ref 30.0–36.0)
MCV: 86.8 fL (ref 78.0–100.0)
Platelets: 288 10*3/uL (ref 150–400)
Platelets: 289 10*3/uL (ref 150–400)
RBC: 4.68 MIL/uL (ref 3.87–5.11)
RDW: 13 % (ref 11.5–15.5)
RDW: 13.1 % (ref 11.5–15.5)
WBC: 10.3 10*3/uL (ref 4.0–10.5)
WBC: 12 10*3/uL — ABNORMAL HIGH (ref 4.0–10.5)

## 2011-11-07 LAB — DIFFERENTIAL
Basophils Absolute: 0 10*3/uL (ref 0.0–0.1)
Eosinophils Relative: 3 % (ref 0–5)
Lymphocytes Relative: 26 % (ref 12–46)
Lymphs Abs: 3.2 10*3/uL (ref 0.7–4.0)
Neutro Abs: 7.3 10*3/uL (ref 1.7–7.7)
Neutrophils Relative %: 61 % (ref 43–77)

## 2011-11-07 LAB — MRSA PCR SCREENING: MRSA by PCR: NEGATIVE

## 2011-11-07 LAB — HEMOGLOBIN A1C: Hgb A1c MFr Bld: 8.2 % — ABNORMAL HIGH (ref <5.7)

## 2011-11-07 LAB — CK: Total CK: 3201 U/L — ABNORMAL HIGH (ref 7–177)

## 2011-11-07 LAB — SEDIMENTATION RATE: Sed Rate: 40 mm/hr — ABNORMAL HIGH (ref 0–22)

## 2011-11-07 LAB — URINE MICROSCOPIC-ADD ON

## 2011-11-07 MED ORDER — ACETAMINOPHEN 325 MG PO TABS
650.0000 mg | ORAL_TABLET | Freq: Four times a day (QID) | ORAL | Status: DC | PRN
  Administered 2011-11-07 – 2011-11-09 (×4): 650 mg via ORAL
  Filled 2011-11-07 (×4): qty 2

## 2011-11-07 MED ORDER — INSULIN ASPART 100 UNIT/ML ~~LOC~~ SOLN
0.0000 [IU] | Freq: Three times a day (TID) | SUBCUTANEOUS | Status: DC
  Administered 2011-11-07: 3 [IU] via SUBCUTANEOUS
  Administered 2011-11-08: 1 [IU] via SUBCUTANEOUS
  Administered 2011-11-09: 12:00:00 via SUBCUTANEOUS
  Administered 2011-11-10: 2 [IU] via SUBCUTANEOUS

## 2011-11-07 MED ORDER — ATORVASTATIN CALCIUM 20 MG PO TABS: 20.0000 mg | ORAL_TABLET | Freq: Every day | ORAL | Status: DC

## 2011-11-07 MED ORDER — ENOXAPARIN SODIUM 40 MG/0.4ML ~~LOC~~ SOLN
40.0000 mg | SUBCUTANEOUS | Status: DC
  Administered 2011-11-07 – 2011-11-10 (×4): 40 mg via SUBCUTANEOUS
  Filled 2011-11-07 (×4): qty 0.4

## 2011-11-07 MED ORDER — SODIUM CHLORIDE 0.9 % IV SOLN
INTRAVENOUS | Status: DC
  Administered 2011-11-07 – 2011-11-09 (×3): via INTRAVENOUS

## 2011-11-07 MED ORDER — BISACODYL 10 MG RE SUPP: 10.0000 mg | Freq: Every day | RECTAL | Status: DC | PRN

## 2011-11-07 MED ORDER — OMEPRAZOLE MAGNESIUM 20 MG PO TBEC: 20.0000 mg | DELAYED_RELEASE_TABLET | Freq: Every day | ORAL | Status: DC

## 2011-11-07 MED ORDER — PANTOPRAZOLE SODIUM 40 MG PO TBEC
40.0000 mg | DELAYED_RELEASE_TABLET | Freq: Every day | ORAL | Status: DC
  Administered 2011-11-07 – 2011-11-10 (×4): 40 mg via ORAL
  Filled 2011-11-07 (×4): qty 1

## 2011-11-07 MED ORDER — LEVOTHYROXINE SODIUM 75 MCG PO TABS
75.0000 ug | ORAL_TABLET | Freq: Every day | ORAL | Status: DC
  Administered 2011-11-07 – 2011-11-10 (×4): 75 ug via ORAL
  Filled 2011-11-07 (×4): qty 1

## 2011-11-07 MED ORDER — AMLODIPINE BESYLATE 5 MG PO TABS
10.0000 mg | ORAL_TABLET | Freq: Every day | ORAL | Status: DC
  Administered 2011-11-07 – 2011-11-10 (×4): 10 mg via ORAL
  Filled 2011-11-07 (×4): qty 2

## 2011-11-07 MED ORDER — SIMVASTATIN 20 MG PO TABS: 40.0000 mg | ORAL_TABLET | Freq: Every day | ORAL | Status: DC

## 2011-11-07 MED ORDER — GLIMEPIRIDE 2 MG PO TABS
1.0000 mg | ORAL_TABLET | Freq: Three times a day (TID) | ORAL | Status: DC
  Administered 2011-11-07: 1 mg via ORAL
  Filled 2011-11-07: qty 1

## 2011-11-07 MED ORDER — SODIUM CHLORIDE 0.9 % IJ SOLN
INTRAMUSCULAR | Status: AC
  Administered 2011-11-07: 12:00:00
  Filled 2011-11-07: qty 3

## 2011-11-07 MED ORDER — FLUCONAZOLE 100 MG PO TABS
100.0000 mg | ORAL_TABLET | Freq: Every day | ORAL | Status: DC
  Administered 2011-11-07 – 2011-11-09 (×3): 100 mg via ORAL
  Filled 2011-11-07 (×3): qty 1

## 2011-11-07 MED ORDER — FLEET ENEMA 7-19 GM/118ML RE ENEM: 1.0000 | ENEMA | Freq: Once | RECTAL | Status: AC | PRN

## 2011-11-07 MED ORDER — GLIMEPIRIDE 2 MG PO TABS
4.0000 mg | ORAL_TABLET | Freq: Every day | ORAL | Status: DC
  Administered 2011-11-08 – 2011-11-10 (×3): 4 mg via ORAL
  Filled 2011-11-07 (×4): qty 2

## 2011-11-07 MED ORDER — SODIUM CHLORIDE 0.9 % IJ SOLN
INTRAMUSCULAR | Status: AC
  Administered 2011-11-07: 10 mL
  Filled 2011-11-07: qty 3

## 2011-11-07 MED ORDER — KETOCONAZOLE 2 % EX CREA
TOPICAL_CREAM | Freq: Every day | CUTANEOUS | Status: DC
  Administered 2011-11-07 – 2011-11-09 (×3): via TOPICAL
  Filled 2011-11-07: qty 15

## 2011-11-07 MED ORDER — ACETAMINOPHEN 650 MG RE SUPP: 650.0000 mg | Freq: Four times a day (QID) | RECTAL | Status: DC | PRN

## 2011-11-07 MED ORDER — LORAZEPAM 0.5 MG PO TABS
0.5000 mg | ORAL_TABLET | ORAL | Status: AC
  Administered 2011-11-07: 0.5 mg via ORAL
  Filled 2011-11-07: qty 1

## 2011-11-07 MED ORDER — ONDANSETRON HCL 4 MG/2ML IJ SOLN: 4.0000 mg | Freq: Four times a day (QID) | INTRAMUSCULAR | Status: DC | PRN

## 2011-11-07 MED ORDER — SODIUM CHLORIDE 0.9 % IJ SOLN
INTRAMUSCULAR | Status: AC
  Administered 2011-11-07: 10:00:00
  Filled 2011-11-07: qty 3

## 2011-11-07 MED ORDER — TRAZODONE HCL 50 MG PO TABS: 25.0000 mg | ORAL_TABLET | Freq: Every evening | ORAL | Status: DC | PRN

## 2011-11-07 MED ORDER — METOPROLOL TARTRATE 50 MG PO TABS
50.0000 mg | ORAL_TABLET | Freq: Two times a day (BID) | ORAL | Status: DC
  Administered 2011-11-07 – 2011-11-10 (×7): 50 mg via ORAL
  Filled 2011-11-07 (×7): qty 1

## 2011-11-07 MED ORDER — INSULIN ASPART 100 UNIT/ML ~~LOC~~ SOLN
0.0000 [IU] | Freq: Every day | SUBCUTANEOUS | Status: DC
  Administered 2011-11-07: 2 [IU] via SUBCUTANEOUS
  Administered 2011-11-08: 0 [IU] via SUBCUTANEOUS

## 2011-11-07 MED ORDER — ASPIRIN 81 MG PO CHEW
81.0000 mg | CHEWABLE_TABLET | Freq: Every day | ORAL | Status: DC
  Administered 2011-11-07 – 2011-11-10 (×4): 81 mg via ORAL
  Filled 2011-11-07 (×4): qty 1

## 2011-11-07 MED ORDER — POTASSIUM CHLORIDE IN NACL 20-0.9 MEQ/L-% IV SOLN
INTRAVENOUS | Status: DC
  Administered 2011-11-07: 09:00:00 via INTRAVENOUS

## 2011-11-07 MED ORDER — COLCHICINE 0.6 MG PO TABS
0.6000 mg | ORAL_TABLET | Freq: Every day | ORAL | Status: DC
  Administered 2011-11-07 – 2011-11-10 (×4): 0.6 mg via ORAL
  Filled 2011-11-07 (×4): qty 1

## 2011-11-07 MED ORDER — ONDANSETRON HCL 4 MG PO TABS: 4.0000 mg | ORAL_TABLET | Freq: Four times a day (QID) | ORAL | Status: DC | PRN

## 2011-11-07 NOTE — H&P (Signed)
PCP:  [facility]   Chief Complaint:  Inability to walk for one week  HPI: Julia Maynard is an 78 y.o. female.   Obese Caucasian lady; lives with daughter;  diabetes, hypertension, gout, usually ambulates with the assistance of a walker; has been having difficulty walking for about 1 week now, no pain, no numbness or tingling, or legs just simply giving way and she is collapsing when attempting to walk. Denies back or limb pain denies recent flulike illness. Denies lateralizing weakness, difficulty with speech or swallowing, new blurring of vision.  X-rays in the emergency room showed old compression fracture  Rewiew of Systems:  The patient denies anorexia, fever, weight loss,, vision loss, decreased hearing, hoarseness, chest pain, syncope, dyspnea on exertion, peripheral edema, balance deficits, hemoptysis, abdominal pain, melena, hematochezia, severe indigestion/heartburn, hematuria, incontinence, genital sores, muscle weakness, suspicious skin lesions, transient blindness,  depression, unusual weight change, abnormal bleeding, enlarged lymph nodes, angioedema, and breast masses.   And treated for candida by Dr. Despina Hidden gynecologist   Past Medical History  Diagnosis Date  . Hypertension   . Renal disorder   . Diabetes mellitus   . Urinary tract infection   . CHF (congestive heart failure)   . Thyroid disease   . Reflux   . Cataract     Past Surgical History  Procedure Date  . Cholecystectomy     Medications:  HOME MEDS: Prior to Admission medications   Medication Sig Start Date End Date Taking? Authorizing Provider  amLODipine (NORVASC) 10 MG tablet Take 10 mg by mouth daily.   Yes Historical Provider, MD  aspirin 81 MG tablet Take 81 mg by mouth daily.   Yes Historical Provider, MD  clobetasol cream (TEMOVATE) 0.05 % Apply topically 2 (two) times daily.   Yes Historical Provider, MD  colchicine 0.6 MG tablet Take 0.6 mg by mouth daily.   Yes Historical Provider,  MD  ergocalciferol (VITAMIN D2) 50000 UNITS capsule Take 50,000 Units by mouth every 30 (thirty) days.   Yes Historical Provider, MD  fluconazole (DIFLUCAN) 100 MG tablet Take 100 mg by mouth daily.   Yes Historical Provider, MD  glimepiride (AMARYL) 1 MG tablet Take 1 mg by mouth 3 (three) times daily.    Yes Historical Provider, MD  ketoconazole (NIZORAL) 2 % cream Apply topically daily.   Yes Historical Provider, MD  levothyroxine (SYNTHROID, LEVOTHROID) 75 MCG tablet Take 75 mcg by mouth daily.   Yes Historical Provider, MD  lovastatin (MEVACOR) 40 MG tablet Take 80 mg by mouth at bedtime.    Yes Historical Provider, MD  metoprolol (LOPRESSOR) 50 MG tablet Take 50 mg by mouth 2 (two) times daily.   Yes Historical Provider, MD  omeprazole (PRILOSEC OTC) 20 MG tablet Take 20 mg by mouth daily.   Yes Historical Provider, MD  polysaccharide iron (NIFEREX) 150 MG CAPS capsule Take 150 mg by mouth daily.   Yes Historical Provider, MD     Allergies:  Allergies  Allergen Reactions  . Ace Inhibitors   . Angiotensin Receptor Blockers   . Lasamide (Furosemide)   . Nitrofuran Derivatives   . Sulfa Antibiotics     Social History:   reports that she has never smoked. Her smokeless tobacco use includes Snuff. She reports that she does not drink alcohol or use illicit drugs.  Family History: History reviewed. No pertinent family history.   Physical Exam: Filed Vitals:   11/07/11 8413 11/07/11 0450 11/07/11 2440 11/07/11 0613  BP: 166/69  141/71  157/86  Pulse: 74 79  60  Temp: 98.2 F (36.8 C)  99.4 F (37.4 C)   TempSrc: Oral  Oral   Resp: 18 20  16   Height:   5\' 2"  (1.575 m) 5\' 2"  (1.575 m)  Weight:   83.3 kg (183 lb 10.3 oz) 83.3 kg (183 lb 10.3 oz)  SpO2: 96% 95%     Blood pressure 157/86, pulse 60, temperature 99.4 F (37.4 C), temperature source Oral, resp. rate 16, height 5\' 2"  (1.575 m), weight 83.3 kg (183 lb 10.3 oz), SpO2 95.00%.  GEN:  Pleasant  obese Caucasian  lying in  the stretcher in no acute distress; cooperative with exam PSYCH:  alert and oriented x4; does not appear anxious or depressed; affect is appropriate. HEENT: Mucous membranes pink and anicteric; PERRLA; EOM intact; no cervical lymphadenopathy nor thyromegaly or carotid bruit; no JVD; Breasts:: Not examined CHEST WALL: No tenderness CHEST: Normal respiration, clear to auscultation bilaterally HEART: Regular rate and rhythm; no murmurs rubs or gallops BACK:  no CVA tenderness ABDOMEN: Obese, soft non-tender; no masses, no organomegaly, normal abdominal bowel sounds;  large pannus;  mild intertriginous candida. Rectal Exam: Not done EXTREMITIES: extensive chronic arthropathy of knees, or effusions ; no edema; no ulcerations. Genitalia: not examined PULSES: 2+ and symmetric SKIN: Normal hydration no rash or ulceration CNS: Cranial nerves 2-12 grossly intact no focal lateralizing neurologic deficit; absent knee and ankle reflex s bilaterally; mild weakness of the knee extensors bilaterally;   Labs & Imaging Results for orders placed during the hospital encounter of 11/06/11 (from the past 48 hour(s))  CBC     Status: Abnormal   Collection Time   11/06/11 11:40 PM      Component Value Range Comment   WBC 12.0 (*) 4.0 - 10.5 (K/uL)    RBC 4.68  3.87 - 5.11 (MIL/uL)    Hemoglobin 14.3  12.0 - 15.0 (g/dL)    HCT 16.1  09.6 - 04.5 (%)    MCV 87.0  78.0 - 100.0 (fL)    MCH 30.6  26.0 - 34.0 (pg)    MCHC 35.1  30.0 - 36.0 (g/dL)    RDW 40.9  81.1 - 91.4 (%)    Platelets 289  150 - 400 (K/uL)   DIFFERENTIAL     Status: Abnormal   Collection Time   11/06/11 11:40 PM      Component Value Range Comment   Neutrophils Relative 61  43 - 77 (%)    Neutro Abs 7.3  1.7 - 7.7 (K/uL)    Lymphocytes Relative 26  12 - 46 (%)    Lymphs Abs 3.2  0.7 - 4.0 (K/uL)    Monocytes Relative 10  3 - 12 (%)    Monocytes Absolute 1.2 (*) 0.1 - 1.0 (K/uL)    Eosinophils Relative 3  0 - 5 (%)    Eosinophils Absolute  0.3  0.0 - 0.7 (K/uL)    Basophils Relative 0  0 - 1 (%)    Basophils Absolute 0.0  0.0 - 0.1 (K/uL)   COMPREHENSIVE METABOLIC PANEL     Status: Abnormal   Collection Time   11/06/11 11:40 PM      Component Value Range Comment   Sodium 126 (*) 135 - 145 (mEq/L)    Potassium 4.1  3.5 - 5.1 (mEq/L)    Chloride 89 (*) 96 - 112 (mEq/L)    CO2 24  19 - 32 (mEq/L)  Glucose, Bld 244 (*) 70 - 99 (mg/dL)    BUN 21  6 - 23 (mg/dL)    Creatinine, Ser 1.19 (*) 0.50 - 1.10 (mg/dL)    Calcium 14.7  8.4 - 10.5 (mg/dL)    Total Protein 7.7  6.0 - 8.3 (g/dL)    Albumin 3.7  3.5 - 5.2 (g/dL)    AST 829 (*) 0 - 37 (U/L)    ALT 105 (*) 0 - 35 (U/L)    Alkaline Phosphatase 36 (*) 39 - 117 (U/L)    Total Bilirubin 0.2 (*) 0.3 - 1.2 (mg/dL)    GFR calc non Af Amer 47 (*) >90 (mL/min)    GFR calc Af Amer 54 (*) >90 (mL/min)   URINALYSIS, ROUTINE W REFLEX MICROSCOPIC     Status: Abnormal   Collection Time   11/06/11 11:55 PM      Component Value Range Comment   Color, Urine YELLOW  YELLOW     APPearance CLEAR  CLEAR     Specific Gravity, Urine 1.015  1.005 - 1.030     pH 6.0  5.0 - 8.0     Glucose, UA 250 (*) NEGATIVE (mg/dL)    Hgb urine dipstick LARGE (*) NEGATIVE     Bilirubin Urine NEGATIVE  NEGATIVE     Ketones, ur NEGATIVE  NEGATIVE (mg/dL)    Protein, ur 562 (*) NEGATIVE (mg/dL)    Urobilinogen, UA 0.2  0.0 - 1.0 (mg/dL)    Nitrite NEGATIVE  NEGATIVE     Leukocytes, UA NEGATIVE  NEGATIVE    URINE MICROSCOPIC-ADD ON     Status: Abnormal   Collection Time   11/06/11 11:55 PM      Component Value Range Comment   Squamous Epithelial / LPF FEW (*) RARE     WBC, UA 7-10  <3 (WBC/hpf)    RBC / HPF 3-6  <3 (RBC/hpf)    Bacteria, UA FEW (*) RARE     Dg Lumbar Spine Complete  11/07/2011  *RADIOLOGY REPORT*  Clinical Data: Back pain, bilateral lower extremity weakness  LUMBAR SPINE - COMPLETE 4+ VIEW  Comparison: CT 09/24/2009  Findings: Mild superior endplate compression deformity noted at  multiple levels, stable at L3 and T11.  Mild diffuse loss of vertebral body height is noted elsewhere.  No new compression deformity.  No malalignment.  Moderate atherosclerotic aortic calcification noted without calcified aneurysm.  IMPRESSION: Stable compression deformities as above.  Original Report Authenticated By: Harrel Lemon, M.D.   Ct Head Wo Contrast  11/07/2011  *RADIOLOGY REPORT*  Clinical Data: Bilateral lower extremity weakness.  Low back pain for 1 week.  CT HEAD WITHOUT CONTRAST  Technique:  Contiguous axial images were obtained from the base of the skull through the vertex without contrast.  Comparison: 01/13/2009 no  Findings: Diffuse cerebral atrophy.  Mild ventricular dilatation consistent with central atrophy.  Low attenuation change in the deep white matter consistent with small vessel ischemia.  No mass effect or midline shift.  No abnormal extra-axial fluid collections.  Gray-white matter junctions are distinct.  Basal cisterns are not effaced.  No evidence of acute intracranial hemorrhage.  No depressed skull fractures.  Opacification of the left sphenoid sinuses.  Mastoid air cells are not opacified. Stable appearance since prior study.  IMPRESSION: No acute intracranial abnormalities.  Chronic atrophy and small vessel ischemic change.  Opacification of left sphenoid sinuses. Stable appearance since previous study.  Original Report Authenticated By: Marlon Pel, M.D.  Assessment Present on Admission:   .Paraparesis of both lower limbs .Hyponatremia .HTN (hypertension) .Type II or unspecified type diabetes mellitus without mention of complication, not stated as uncontrolled .Gout .Hyperlipidemia .Hypothyroid .GERD (gastroesophageal reflux disease)   PLAN: although it is possible that this lady's symptoms are brought on by the hyponatremia, focal weaknesses unusual, so we'll admit for MRI of the lumbar spine, and neurology consult if indicated. Physical  therapy consult  No cause of dehydration is found, obvious cause of hyponatremia, will treat with simple hydration for now, evaluate further if it proves to be refractory;  Continue management of her chronic medical conditions    Other plans as per orders.   Audon Heymann 11/07/2011, 6:33 AM

## 2011-11-07 NOTE — Evaluation (Signed)
Physical Therapy Evaluation Patient Details Name: Julia Maynard MRN: [id number] DOB: 1931-09-08 Today's Date: 11/07/2011 Time: 1191-4782 PT Time Calculation (min): 41 min  PT Assessment / Plan / Recommendation Clinical Impression  Pt is very pleasant and cooperative, probably much stronger today than at time of admission, however she does have generalized deconditioning along with significant weakness in knees.  She does c/o aching in both knees, radiating to calves and states that this is making walking dificult.   She would qualify for SNF at d/c and this was discussed with pt and daughter.  They are going to think about it and decide what they want to do.    PT Assessment  Patient needs continued PT services    Follow Up Recommendations  Home health PT;Skilled nursing facility (per family preference)    Equipment Recommendations  None recommended by PT    Frequency Min 3X/week    Precautions / Restrictions Precautions Precautions: Fall   Pertinent Vitals/Pain       Mobility  Bed Mobility Bed Mobility: Rolling Right;Rolling Left;Supine to Sit;Sit to Supine Rolling Right: 7: Independent Rolling Left: 7: Independent Supine to Sit: 7: Independent Sit to Supine: 7: Independent Transfers Transfers: Sit to Stand;Stand to Sit Sit to Stand: 4: Min assist Stand to Sit: 4: Min assist Details for Transfer Assistance: because of knee weakness, pt has difficulty rising from sitting...needs a boost....very unsafe technique for sitting, falls into chair Ambulation/Gait Ambulation/Gait Assistance: 4: Min guard Ambulation Distance (Feet): 10 Feet Assistive device: Rolling walker Ambulation/Gait Assistance Details: with each step, knees flex a bit more and pt feels that her knees will give way Gait Pattern: Step-through pattern;Left flexed knee in stance;Right flexed knee in stance;Trunk flexed;Shuffle Stairs: No Corporate treasurer: No    Exercises     PT  Goals Acute Rehab PT Goals PT Goal Formulation: With patient/family Pt will go Sit to Stand: with modified independence PT Goal: Sit to Stand - Progress: Goal set today Pt will go Stand to Sit: with modified independence PT Goal: Stand to Sit - Progress: Goal set today Pt will Ambulate: 16 - 50 feet;with supervision;with rolling walker PT Goal: Ambulate - Progress: Goal set today  Visit Information  Last PT Received On: 11/07/11    Subjective Data  Subjective: my knees are buckling out Patient Stated Goal: independent at home   Prior Functioning  Home Living Lives With: Family Available Help at Discharge: Family Type of Home: House Home Access: Ramped entrance Home Layout: One level Bathroom Shower/Tub:  (takes a sponge bath) Bathroom Toilet: Handicapped height Bathroom Accessibility: Yes How Accessible: Accessible via walker Home Adaptive Equipment: Bedside commode/3-in-1;Hospital bed;Walker - rolling;Wheelchair - manual Prior Function Level of Independence: Needs assistance Needs Assistance: Bathing;Meal Prep;Light Housekeeping Able to Take Stairs?: No Driving: No Vocation: Retired Musician: No difficulties    Cognition  Overall Cognitive Status: Appears within functional limits for tasks assessed/performed Arousal/Alertness: Awake/alert Orientation Level: Appears intact for tasks assessed Behavior During Session: [facility] for tasks performed    Extremity/Trunk Assessment Right Upper Extremity Assessment RUE ROM/Strength/Tone: Within functional levels RUE Sensation: WFL - Light Touch;WFL - Proprioception RUE Coordination: WFL - gross/fine motor Left Upper Extremity Assessment LUE ROM/Strength/Tone: Within functional levels LUE Sensation: WFL - Light Touch;WFL - Proprioception LUE Coordination: WFL - gross/fine motor Right Lower Extremity Assessment RLE ROM/Strength/Tone: Deficits RLE ROM/Strength/Tone Deficits: strength of iliopsoas = 3/5,  quadriceps = 3+/5 RLE Sensation: WFL - Light Touch;WFL - Proprioception RLE Coordination: WFL - gross/fine motor Left  Lower Extremity Assessment LLE ROM/Strength/Tone: Deficits LLE ROM/Strength/Tone Deficits: see note for RLE LLE Sensation: WFL - Light Touch;WFL - Proprioception LLE Coordination: WFL - gross/fine motor Trunk Assessment Trunk Assessment: Kyphotic   Balance Balance Balance Assessed: No  End of Session PT - End of Session Equipment Utilized During Treatment: Gait belt Activity Tolerance: Patient tolerated treatment well Patient left: in chair;with call bell/phone within reach;with family/visitor present Nurse Communication: Mobility status   Konrad Penta 11/07/2011, 1:20 PM

## 2011-11-07 NOTE — Progress Notes (Signed)
Chart reviewed. Patient examined. Leg strength improved. Having leg pain. CPK is over 3000. MRI pending.  stop the statin and change fluids to saline increased rate.

## 2011-11-08 DIAGNOSIS — R5381 Other malaise: Secondary | ICD-10-CM | POA: Diagnosis present

## 2011-11-08 LAB — BASIC METABOLIC PANEL
CO2: 24 mEq/L (ref 19–32)
Glucose, Bld: 93 mg/dL (ref 70–99)
Potassium: 3.7 mEq/L (ref 3.5–5.1)
Sodium: 132 mEq/L — ABNORMAL LOW (ref 135–145)

## 2011-11-08 LAB — GLUCOSE, CAPILLARY
Glucose-Capillary: 103 mg/dL — ABNORMAL HIGH (ref 70–99)
Glucose-Capillary: 140 mg/dL — ABNORMAL HIGH (ref 70–99)

## 2011-11-08 LAB — CBC
HCT: 36.1 % (ref 36.0–46.0)
Hemoglobin: 12.4 g/dL (ref 12.0–15.0)
RBC: 4.14 MIL/uL (ref 3.87–5.11)
WBC: 8.2 10*3/uL (ref 4.0–10.5)

## 2011-11-08 MED ORDER — SODIUM CHLORIDE 0.9 % IJ SOLN
INTRAMUSCULAR | Status: AC
  Administered 2011-11-08: 10 mL
  Filled 2011-11-08: qty 3

## 2011-11-08 NOTE — Progress Notes (Signed)
Utilization review completed.  

## 2011-11-08 NOTE — Progress Notes (Signed)
Subjective: Feels stronger today.  Less leg pain.  Agreeable to [facility]  Objective: Vital signs in last 24 hours: Filed Vitals:   11/08/11 0037 11/08/11 0044 11/08/11 0400 11/08/11 0500  BP: 131/104 125/58 147/61   Pulse: 61  68   Temp: 97.7 F (36.5 C)  97.9 F (36.6 C)   TempSrc: Oral  Oral   Resp: 18  20   Height:      Weight:    85.3 kg (188 lb 0.8 oz)  SpO2: 95%  96%    Weight change: 1.385 kg (3 lb 0.8 oz)  Intake/Output Summary (Last 24 hours) at 11/08/11 1121 Last data filed at 11/08/11 4098  Gross per 24 hour  Intake 2006.67 ml  Output      0 ml  Net 2006.67 ml   Physical Exam: General: More comfortable. Still quite weak. Lungs clear to auscultation bilaterally without wheeze rhonchi or rales Cardiovascular regular rate rhythm without murmurs gallops rubs Abdomen obese soft nontender Extremities no clubbing cyanosis or edema  Lab Results: Basic Metabolic Panel:  Lab 11/08/11 1191 11/07/11 0617 11/06/11 2340  NA 132* -- 126*  K 3.7 -- 4.1  CL 97 -- 89*  CO2 24 -- 24  GLUCOSE 93 -- 244*  BUN 16 -- 21  CREATININE 0.93 1.01 --  CALCIUM 9.0 -- 10.0  MG -- -- --  PHOS -- -- --   Liver Function Tests:  Lab 11/06/11 2340  AST 105*  ALT 105*  ALKPHOS 36*  BILITOT 0.2*  PROT 7.7  ALBUMIN 3.7   No results found for this basename: LIPASE:2,AMYLASE:2 in the last 168 hours No results found for this basename: AMMONIA:2 in the last 168 hours CBC:  Lab 11/08/11 0448 11/07/11 0617 11/06/11 2340  WBC 8.2 10.3 --  NEUTROABS -- -- 7.3  HGB 12.4 13.1 --  HCT 36.1 38.3 --  MCV 87.2 86.8 --  PLT 237 288 --   Cardiac Enzymes:  Lab 11/08/11 0448 11/07/11 0617  CKTOTAL 2179* 3201*  CKMB -- --  CKMBINDEX -- --  TROPONINI -- --   BNP: No results found for this basename: PROBNP:3 in the last 168 hours D-Dimer: No results found for this basename: DDIMER:2 in the last 168 hours CBG:  Lab 11/07/11 2150 11/07/11 1704 11/07/11 1143  GLUCAP 212* 101* 249*    Hemoglobin A1C:  Lab 11/07/11 0617  HGBA1C 8.2*   Fasting Lipid Panel: No results found for this basename: CHOL,HDL,LDLCALC,TRIG,CHOLHDL,LDLDIRECT in the last 478 hours Thyroid Function Tests:  Lab 11/07/11 0617  TSH 1.226  T4TOTAL --  FREET4 --  T3FREE --  THYROIDAB --   Coagulation: No results found for this basename: LABPROT:4,INR:4 in the last 168 hours Anemia Panel: No results found for this basename: VITAMINB12,FOLATE,FERRITIN,TIBC,IRON,RETICCTPCT in the last 168 hours Urine Drug Screen: Drugs of Abuse  No results found for this basename: labopia, cocainscrnur, labbenz, amphetmu, thcu, labbarb    Alcohol Level: No results found for this basename: ETH:2 in the last 168 hours Urinalysis:  Lab 11/06/11 2355  COLORURINE YELLOW  LABSPEC 1.015  PHURINE 6.0  GLUCOSEU 250*  HGBUR LARGE*  BILIRUBINUR NEGATIVE  KETONESUR NEGATIVE  PROTEINUR 100*  UROBILINOGEN 0.2  NITRITE NEGATIVE  LEUKOCYTESUR NEGATIVE   Micro Results: Recent Results (from the past 240 hour(s))  MRSA PCR SCREENING     Status: Normal   Collection Time   11/07/11  5:52 AM      Component Value Range Status Comment   MRSA by  PCR NEGATIVE  NEGATIVE  Final    Studies/Results: Dg Lumbar Spine Complete  11/07/2011  *RADIOLOGY REPORT*  Clinical Data: Back pain, bilateral lower extremity weakness  LUMBAR SPINE - COMPLETE 4+ VIEW  Comparison: CT 09/24/2009  Findings: Mild superior endplate compression deformity noted at multiple levels, stable at L3 and T11.  Mild diffuse loss of vertebral body height is noted elsewhere.  No new compression deformity.  No malalignment.  Moderate atherosclerotic aortic calcification noted without calcified aneurysm.  IMPRESSION: Stable compression deformities as above.  Original Report Authenticated By: Harrel Lemon, M.D.   Ct Head Wo Contrast  11/07/2011  *RADIOLOGY REPORT*  Clinical Data: Bilateral lower extremity weakness.  Low back pain for 1 week.  CT HEAD WITHOUT  CONTRAST  Technique:  Contiguous axial images were obtained from the base of the skull through the vertex without contrast.  Comparison: 01/13/2009 no  Findings: Diffuse cerebral atrophy.  Mild ventricular dilatation consistent with central atrophy.  Low attenuation change in the deep white matter consistent with small vessel ischemia.  No mass effect or midline shift.  No abnormal extra-axial fluid collections.  Gray-white matter junctions are distinct.  Basal cisterns are not effaced.  No evidence of acute intracranial hemorrhage.  No depressed skull fractures.  Opacification of the left sphenoid sinuses.  Mastoid air cells are not opacified. Stable appearance since prior study.  IMPRESSION: No acute intracranial abnormalities.  Chronic atrophy and small vessel ischemic change.  Opacification of left sphenoid sinuses. Stable appearance since previous study.  Original Report Authenticated By: Marlon Pel, M.D.   Mr Lumbar Spine Wo Contrast  11/07/2011  *RADIOLOGY REPORT*  Clinical Data: Bilateral leg numbness with difficulty walking.  No known injury or prior relevant surgery.  MRI LUMBAR SPINE WITHOUT CONTRAST  Technique:  Multiplanar and multiecho pulse sequences of the lumbar spine were obtained without intravenous contrast.  Comparison: Lumbar spine radiographs 11/07/2011.  Abdominal pelvic CT 09/24/2009.  Renal ultrasound 01/14/2009.  Findings: Prior studies demonstrate five lumbar type vertebral bodies.  The alignment is normal.  There is no evidence of fracture or pars defect.  The conus medullaris extends to the L1 level and appears normal. There are no paraspinal abnormalities. Both kidneys demonstrate cortical thinning.  There is prominent cortical lobularity in the lower pole of the left kidney with central low T2 signal.  Contours of the lower pole of the left kidney appear unchanged from the prior CT.  Diverticulum in the second portion of the duodenum is noted.  There are no significant disc  space findings from T11-T12 through L1-L2.  There are anterior osteophytes at T11-T12 and T12-L1.  L2-L3:  There is disc bulging asymmetric to the left with mild facet and ligamentous hypertrophy.  There is mild resulting central stenosis.  No nerve root encroachment seen.  L3-L4:  Mild symmetric disc bulging with facet and ligamentous hypertrophy.  There is mild resulting central stenosis.  No foraminal compromise or nerve root encroachment results.  L4-L5:  Mild symmetric disc bulging with facet and ligamentous hypertrophy.  No significant spinal stenosis or nerve root encroachment.  L5-S1:  Disc height and hydration are maintained.  There is mild bilateral facet hypertrophy.  No spinal stenosis or nerve root encroachment.  IMPRESSION:  1.  Mild lower lumbar spondylosis as described.  There is mild central stenosis at L2-L3 and L3-L4. 2.  No focal disc protrusion, foraminal compromise or nerve root encroachment. 3.  No acute osseous findings or malalignment. 4.  Cortical prominence  of the lower pole of the left kidney is grossly unchanged from CT of greater than 2 years ago, and therefore unlikely to be clinically significant.  Original Report Authenticated By: Gerrianne Scale, M.D.   Scheduled Meds:   . amLODipine  10 mg Oral Daily  . aspirin  81 mg Oral Daily  . colchicine  0.6 mg Oral Daily  . enoxaparin  40 mg Subcutaneous Q24H  . fluconazole  100 mg Oral Daily  . glimepiride  4 mg Oral Q breakfast  . insulin aspart  0-5 Units Subcutaneous QHS  . insulin aspart  0-9 Units Subcutaneous TID WC  . ketoconazole   Topical Daily  . levothyroxine  75 mcg Oral Daily  . LORazepam  0.5 mg Oral On Call  . metoprolol  50 mg Oral BID  . pantoprazole  40 mg Oral QAC breakfast  . sodium chloride      . sodium chloride      . DISCONTD: atorvastatin  20 mg Oral q1800  . DISCONTD: glimepiride  1 mg Oral TID  . DISCONTD: simvastatin  40 mg Oral q1800   Continuous Infusions:   . sodium chloride 100  mL/hr at 11/08/11 0607  . DISCONTD: 0.9 % NaCl with KCl 20 mEq / L Stopped (11/07/11 1600)   PRN Meds:.acetaminophen, bisacodyl, ondansetron (ZOFRAN) IV, ondansetron, sodium phosphate, traZODone, DISCONTD: acetaminophen Assessment/Plan: Active Problems: Weakness: Likely related to rhabdomyolysis and hyponatremia, both of which are improving. MRI shows no significant pathology to explain weakness   Rhabdomyolysis, statin related. Improving. Continue IV fluids.   Hyponatremia improving. TSH normal.   HTN (hypertension)   Type II or unspecified type diabetes mellitus without mention of complication, uncontrolled. Blood glucose better on current regimen.   Gout, quiescent   Hyperlipidemia: No statins. Can take fish STOOLS, low fossa are similar at discharge.   Hypothyroid   GERD (gastroesophageal reflux disease)   Debility: Will likely be ready for discharge to skilled nursing facility in 24-48 hours if she continues to improve.   LOS: 2 days   Julia Maynard L 11/08/2011, 11:21 AM

## 2011-11-09 ENCOUNTER — Encounter (HOSPITAL_COMMUNITY): Payer: Self-pay | Admitting: *Deleted

## 2011-11-09 LAB — BASIC METABOLIC PANEL
CO2: 23 mEq/L (ref 19–32)
GFR calc non Af Amer: 56 mL/min — ABNORMAL LOW (ref >90)
Glucose, Bld: 118 mg/dL — ABNORMAL HIGH (ref 70–99)
Potassium: 3.5 mEq/L (ref 3.5–5.1)
Sodium: 135 mEq/L (ref 135–145)

## 2011-11-09 LAB — GLUCOSE, CAPILLARY
Glucose-Capillary: 113 mg/dL — ABNORMAL HIGH (ref 70–99)
Glucose-Capillary: 170 mg/dL — ABNORMAL HIGH (ref 70–99)

## 2011-11-09 LAB — CK: Total CK: 1206 U/L — ABNORMAL HIGH (ref 7–177)

## 2011-11-09 MED ORDER — POTASSIUM CHLORIDE CRYS ER 20 MEQ PO TBCR
40.0000 meq | EXTENDED_RELEASE_TABLET | Freq: Once | ORAL | Status: AC
  Administered 2011-11-09: 40 meq via ORAL
  Filled 2011-11-09: qty 2

## 2011-11-09 NOTE — Progress Notes (Signed)
Called rep[ort to [employer] re pt. NT and family and Cicily RN took pt to 28 via w/c

## 2011-11-09 NOTE — Progress Notes (Signed)
Subjective: Feels stronger today.  Less leg pain.  Agreeable to [facility]  Objective: Vital signs in last 24 hours: Filed Vitals:   11/09/11 0557 11/09/11 0800 11/09/11 1200 11/09/11 1328  BP: 174/74   133/71  Pulse: 95   66  Temp: 97.8 F (36.6 C) 98.1 F (36.7 C) 98.4 F (36.9 C) 98.4 F (36.9 C)  TempSrc: Oral Oral Oral   Resp: 18   18  Height:      Weight:      SpO2: 96%   96%   Weight change:   Intake/Output Summary (Last 24 hours) at 11/09/11 1545 Last data filed at 11/09/11 1230  Gross per 24 hour  Intake   1600 ml  Output   2450 ml  Net   -850 ml   Physical Exam: General: More comfortable. Appears less weak. Lungs clear to auscultation bilaterally without wheeze rhonchi or rales Cardiovascular regular rate rhythm without murmurs gallops rubs Abdomen obese soft nontender Extremities no clubbing cyanosis or edema. No calf tenderness. Homans sign negative.  Lab Results: Basic Metabolic Panel:  Lab 11/09/11 1884 11/08/11 0448  NA 135 132*  K 3.5 3.7  CL 101 97  CO2 23 24  GLUCOSE 118* 93  BUN 14 16  CREATININE 0.95 0.93  CALCIUM 8.9 9.0  MG -- --  PHOS -- --   Liver Function Tests:  Lab 11/06/11 2340  AST 105*  ALT 105*  ALKPHOS 36*  BILITOT 0.2*  PROT 7.7  ALBUMIN 3.7   No results found for this basename: LIPASE:2,AMYLASE:2 in the last 168 hours No results found for this basename: AMMONIA:2 in the last 168 hours CBC:  Lab 11/08/11 0448 11/07/11 0617 11/06/11 2340  WBC 8.2 10.3 --  NEUTROABS -- -- 7.3  HGB 12.4 13.1 --  HCT 36.1 38.3 --  MCV 87.2 86.8 --  PLT 237 288 --   Cardiac Enzymes:  Lab 11/09/11 0543 11/08/11 0448 11/07/11 0617  CKTOTAL 1206* 2179* 3201*  CKMB -- -- --  CKMBINDEX -- -- --  TROPONINI -- -- --   BNP: No results found for this basename: PROBNP:3 in the last 168 hours D-Dimer: No results found for this basename: DDIMER:2 in the last 168 hours CBG:  Lab 11/09/11 1154 11/09/11 0729 11/08/11 2133 11/08/11 1629  11/08/11 1131 11/08/11 0741  GLUCAP 170* 113* 140* 112* 136* 103*   Hemoglobin A1C:  Lab 11/07/11 0617  HGBA1C 8.2*   Fasting Lipid Panel: No results found for this basename: CHOL,HDL,LDLCALC,TRIG,CHOLHDL,LDLDIRECT in the last 166 hours Thyroid Function Tests:  Lab 11/07/11 0617  TSH 1.226  T4TOTAL --  FREET4 --  T3FREE --  THYROIDAB --   Coagulation: No results found for this basename: LABPROT:4,INR:4 in the last 168 hours Anemia Panel: No results found for this basename: VITAMINB12,FOLATE,FERRITIN,TIBC,IRON,RETICCTPCT in the last 168 hours Urine Drug Screen: Drugs of Abuse  No results found for this basename: labopia,  cocainscrnur,  labbenz,  amphetmu,  thcu,  labbarb    Alcohol Level: No results found for this basename: ETH:2 in the last 168 hours Urinalysis:  Lab 11/06/11 2355  COLORURINE YELLOW  LABSPEC 1.015  PHURINE 6.0  GLUCOSEU 250*  HGBUR LARGE*  BILIRUBINUR NEGATIVE  KETONESUR NEGATIVE  PROTEINUR 100*  UROBILINOGEN 0.2  NITRITE NEGATIVE  LEUKOCYTESUR NEGATIVE   Micro Results: Recent Results (from the past 240 hour(s))  MRSA PCR SCREENING     Status: Normal   Collection Time   11/07/11  5:52 AM  Component Value Range Status Comment   MRSA by PCR NEGATIVE  NEGATIVE  Final    Studies/Results: Mr Lumbar Spine Wo Contrast  11/07/2011  *RADIOLOGY REPORT*  Clinical Data: Bilateral leg numbness with difficulty walking.  No known injury or prior relevant surgery.  MRI LUMBAR SPINE WITHOUT CONTRAST  Technique:  Multiplanar and multiecho pulse sequences of the lumbar spine were obtained without intravenous contrast.  Comparison: Lumbar spine radiographs 11/07/2011.  Abdominal pelvic CT 09/24/2009.  Renal ultrasound 01/14/2009.  Findings: Prior studies demonstrate five lumbar type vertebral bodies.  The alignment is normal.  There is no evidence of fracture or pars defect.  The conus medullaris extends to the L1 level and appears normal. There are no  paraspinal abnormalities. Both kidneys demonstrate cortical thinning.  There is prominent cortical lobularity in the lower pole of the left kidney with central low T2 signal.  Contours of the lower pole of the left kidney appear unchanged from the prior CT.  Diverticulum in the second portion of the duodenum is noted.  There are no significant disc space findings from T11-T12 through L1-L2.  There are anterior osteophytes at T11-T12 and T12-L1.  L2-L3:  There is disc bulging asymmetric to the left with mild facet and ligamentous hypertrophy.  There is mild resulting central stenosis.  No nerve root encroachment seen.  L3-L4:  Mild symmetric disc bulging with facet and ligamentous hypertrophy.  There is mild resulting central stenosis.  No foraminal compromise or nerve root encroachment results.  L4-L5:  Mild symmetric disc bulging with facet and ligamentous hypertrophy.  No significant spinal stenosis or nerve root encroachment.  L5-S1:  Disc height and hydration are maintained.  There is mild bilateral facet hypertrophy.  No spinal stenosis or nerve root encroachment.  IMPRESSION:  1.  Mild lower lumbar spondylosis as described.  There is mild central stenosis at L2-L3 and L3-L4. 2.  No focal disc protrusion, foraminal compromise or nerve root encroachment. 3.  No acute osseous findings or malalignment. 4.  Cortical prominence of the lower pole of the left kidney is grossly unchanged from CT of greater than 2 years ago, and therefore unlikely to be clinically significant.  Original Report Authenticated By: Gerrianne Scale, M.D.   Scheduled Meds:    . amLODipine  10 mg Oral Daily  . aspirin  81 mg Oral Daily  . colchicine  0.6 mg Oral Daily  . enoxaparin  40 mg Subcutaneous Q24H  . glimepiride  4 mg Oral Q breakfast  . insulin aspart  0-5 Units Subcutaneous QHS  . insulin aspart  0-9 Units Subcutaneous TID WC  . levothyroxine  75 mcg Oral Daily  . metoprolol  50 mg Oral BID  . pantoprazole  40 mg  Oral QAC breakfast  . potassium chloride  40 mEq Oral Once  . sodium chloride      . DISCONTD: fluconazole  100 mg Oral Daily  . DISCONTD: ketoconazole   Topical Daily   Continuous Infusions:    . DISCONTD: sodium chloride 100 mL/hr at 11/09/11 0700   PRN Meds:.acetaminophen, bisacodyl, ondansetron (ZOFRAN) IV, ondansetron, traZODone Assessment/Plan: Active Problems: Weakness: Likely related to rhabdomyolysis and hyponatremia, both of which are improving. MRI shows no significant pathology to explain weakness   Rhabdomyolysis, statin related. Improving. Continue IV fluids.   Hyponatremia improving. TSH normal.   HTN (hypertension)   Type II or unspecified type diabetes mellitus without mention of complication, uncontrolled. Blood glucose better on current regimen.   Gout, quiescent  Hyperlipidemia: No statins. Can take fish oil or similar at discharge.   Hypothyroid   GERD (gastroesophageal reflux disease)  Discontinue IV. Transferred to skilled nursing facility when bed available. Discharge order written   LOS: 3 days   Julia Maynard 11/09/2011, 3:45 PM

## 2011-11-09 NOTE — Progress Notes (Signed)
Physical Therapy Treatment Patient Details Name: Julia Maynard MRN: [id number] DOB: 1932/02/16 Today's Date: 11/09/2011 Time: 4098-1191 PT Time Calculation (min): 22 min  PT Assessment / Plan / Recommendation Comments on Treatment Session  Pt tolerated all treatment well and was able to complete 5' of gait training with RW; Min guard due to feeling of LLE feeling weak..    Follow Up Recommendations       Equipment Recommendations       Frequency     Plan      Precautions / Restrictions Restrictions Weight Bearing Restrictions: No   Pertinent Vitals/Pain     Mobility  Bed Mobility Rolling Left: 7: Independent Left Sidelying to Sit: 4: Min assist Details for Bed Mobility Assistance: assistance with trunk needed and verbal cues for use of rail Transfers Sit to Stand: 4: Min guard;With upper extremity assist;From elevated surface Stand to Sit: 6: Modified independent (Device/Increase time) Ambulation/Gait Ambulation/Gait Assistance: 4: Min guard Ambulation Distance (Feet): 43 Feet Assistive device: Rolling walker    Exercises General Exercises - Upper Extremity Shoulder Flexion: Both;10 reps Shoulder Horizontal ABduction: Both;10 reps Shoulder Horizontal ADduction: 10 reps;Both General Exercises - Lower Extremity Ankle Circles/Pumps: 15 reps;Both Quad Sets: Both;10 reps Gluteal Sets: 10 reps Short Arc Quad: 10 reps;Both Long Arc Quad: Both;10 reps Heel Slides: 10 reps;Both Hip ABduction/ADduction: Both;10 reps Straight Leg Raises: 10 reps;Both Heel Raises: Both;10 reps Mini-Sqauts: 10 reps;Both   PT Goals Acute Rehab PT Goals PT Goal: Sit to Stand - Progress: Progressing toward goal PT Goal: Stand to Sit - Progress: Met PT Goal: Ambulate - Progress: Progressing toward goal  Visit Information  Last PT Received On: 11/09/11    Subjective Data      Cognition       Balance     End of Session PT - End of Session Equipment Utilized During Treatment: Gait  belt Activity Tolerance: Patient tolerated treatment well Patient left: in chair;with call bell/phone within reach;with family/visitor present Nurse Communication: Mobility status    Marissa Weaver ATKINSO 11/09/2011, 9:32 AM

## 2011-11-09 NOTE — Progress Notes (Signed)
Spoke briefly with patient and her family at bedside to discuss plans for SNF at d/c. Patient tells me she has been at [facility] in the past and would like to return there upon d/c from hospital. I am completing FL2 and Pasarr for this plan and have rec'd indication from [facility] they would like to accept her possibly tomorrow -pending review of FL2.  MD advised of this and will proceed with this plan and advise- Reece Levy, MSW, [employer] [phone]

## 2011-11-10 LAB — CK: Total CK: 539 U/L — ABNORMAL HIGH (ref 7–177)

## 2011-11-10 LAB — GLUCOSE, CAPILLARY: Glucose-Capillary: 196 mg/dL — ABNORMAL HIGH (ref 70–99)

## 2011-11-10 MED ORDER — ACETAMINOPHEN 325 MG PO TABS: 650.0000 mg | ORAL_TABLET | Freq: Four times a day (QID) | ORAL | Status: AC | PRN

## 2011-11-10 MED ORDER — GLIMEPIRIDE 4 MG PO TABS: 4.0000 mg | ORAL_TABLET | Freq: Every day | ORAL | Status: DC

## 2011-11-10 NOTE — Discharge Summary (Signed)
Physician Discharge Summary  Patient ID: Julia Maynard MRN: [id number] DOB/AGE: 03-05-1932 78 y.o.  Admit date: 11/06/2011 Discharge date: 11/10/2011  Discharge Diagnoses:  Active Problems:  Rhabdomyolysis  Hyponatremia  HTN (hypertension)  Type II or unspecified type diabetes mellitus without mention of complication, not stated as uncontrolled  Gout  Hyperlipidemia  Hypothyroid  GERD (gastroesophageal reflux disease)  Debility   Medication List  As of 11/10/2011 11:16 AM   STOP taking these medications         fluconazole 100 MG tablet      lovastatin 40 MG tablet         TAKE these medications         acetaminophen 325 MG tablet   Commonly known as: TYLENOL   Take 2 tablets (650 mg total) by mouth every 6 (six) hours as needed (or Fever >/= 101).      amLODipine 10 MG tablet   Commonly known as: NORVASC   Take 10 mg by mouth daily.      aspirin 81 MG tablet   Take 81 mg by mouth daily.      colchicine 0.6 MG tablet   Take 0.6 mg by mouth daily.      ergocalciferol 50000 UNITS capsule   Commonly known as: VITAMIN D2   Take 50,000 Units by mouth every 30 (thirty) days.      glimepiride 4 MG tablet   Commonly known as: AMARYL   Take 1 tablet (4 mg total) by mouth daily with breakfast.      levothyroxine 75 MCG tablet   Commonly known as: SYNTHROID, LEVOTHROID   Take 75 mcg by mouth daily.      metoprolol 50 MG tablet   Commonly known as: LOPRESSOR   Take 50 mg by mouth 2 (two) times daily.      omeprazole 20 MG tablet   Commonly known as: PRILOSEC OTC   Take 20 mg by mouth daily.      polysaccharide iron 150 MG Caps capsule   Commonly known as: NIFEREX   Take 150 mg by mouth daily.            Discharge Orders    Future Orders Please Complete By Expires   Diet - low sodium heart healthy      Diet Carb Modified      Walk with assistance         Disposition: SNF  Discharged Condition: stable  Consults:  PT, SW  Labs:    Glucose-Capillary        212 140 194 196    CHEM PROFILE    Sodium      126 132 135     Potassium      4.1 3.7 3.5     Chloride      89 97  101     CO2      24 24 23      Mean Plasma Glucose      189       BUN      21 16 14      Creatinine, Ser      1.01 0.93 0.95     Calcium      10.0 9.0 8.9     GFR calc non Af Amer      52 58 56     GFR calc Af Amer      61  67  65      Glucose, Bld  244 93 118     Alkaline Phosphatase      36       Albumin      3.7       AST      105       ALT      105       Total Protein      7.7       Total Bilirubin      0.2        CARDIAC PROFILE    Total CK      3201 2179 1206 539     OTHER CHEM    CRP      0.36        CBC    WBC      10.3 8.2      RBC      4.41 4.14      Hemoglobin      13.1 12.4      HCT      38.3 36.1      MCV      86.8 87.2      MCH      29.7 30.0      MCHC      34.2 34.3      RDW      13.0 13.2      Platelets      288 237       DIFFERENTIAL    Neutrophils Relative      61       Lymphocytes Relative      26       Monocytes Relative      10       Eosinophils Relative      3       Basophils Relative      0       Neutro Abs      7.3       Lymphs Abs      3.2       Monocytes Absolute      1.2       Eosinophils Absolute      0.3       Basophils Absolute      0.0        OTHER HEMATOLOGY    Sed Rate      40        DIABETES    Glucose, Bld      244 93 118      THYROID    TSH      1.226        URINALYSIS    Color, Urine      YELLOW       APPearance      CLEAR       Specific Gravity, Urine      1.015       pH      6.0       Glucose, UA      250       Bilirubin Urine      NEGATIVE       Ketones, ur      NEGATIVE       Protein, ur      100       Urobilinogen, UA      0.2       Nitrite      NEGATIVE       Leukocytes, UA  NEGATIVE       Hgb urine dipstick      LARGE       WBC, UA      7-10       RBC / HPF      3-6       Squamous Epithelial / LPF      FEW       Bacteria, UA      FEW        Hemoglobin A1c  8.2  Diagnostics:  Dg Lumbar Spine Complete  11/07/2011  *RADIOLOGY REPORT*  Clinical Data: Back pain, bilateral lower extremity weakness  LUMBAR SPINE - COMPLETE 4+ VIEW  Comparison: CT 09/24/2009  Findings: Mild superior endplate compression deformity noted at multiple levels, stable at L3 and T11.  Mild diffuse loss of vertebral body height is noted elsewhere.  No new compression deformity.  No malalignment.  Moderate atherosclerotic aortic calcification noted without calcified aneurysm.  IMPRESSION: Stable compression deformities as above.  Original Report Authenticated By: Harrel Lemon, M.D.   Ct Head Wo Contrast  11/07/2011  *RADIOLOGY REPORT*  Clinical Data: Bilateral lower extremity weakness.  Low back pain for 1 week.  CT HEAD WITHOUT CONTRAST  Technique:  Contiguous axial images were obtained from the base of the skull through the vertex without contrast.  Comparison: 01/13/2009 no  Findings: Diffuse cerebral atrophy.  Mild ventricular dilatation consistent with central atrophy.  Low attenuation change in the deep white matter consistent with small vessel ischemia.  No mass effect or midline shift.  No abnormal extra-axial fluid collections.  Gray-white matter junctions are distinct.  Basal cisterns are not effaced.  No evidence of acute intracranial hemorrhage.  No depressed skull fractures.  Opacification of the left sphenoid sinuses.  Mastoid air cells are not opacified. Stable appearance since prior study.  IMPRESSION: No acute intracranial abnormalities.  Chronic atrophy and small vessel ischemic change.  Opacification of left sphenoid sinuses. Stable appearance since previous study.  Original Report Authenticated By: Marlon Pel, M.D.   Mr Lumbar Spine Wo Contrast  11/07/2011  *RADIOLOGY REPORT*  Clinical Data: Bilateral leg numbness with difficulty walking.  No known injury or prior relevant surgery.  MRI LUMBAR SPINE WITHOUT CONTRAST  Technique:  Multiplanar and multiecho pulse  sequences of the lumbar spine were obtained without intravenous contrast.  Comparison: Lumbar spine radiographs 11/07/2011.  Abdominal pelvic CT 09/24/2009.  Renal ultrasound 01/14/2009.  Findings: Prior studies demonstrate five lumbar type vertebral bodies.  The alignment is normal.  There is no evidence of fracture or pars defect.  The conus medullaris extends to the L1 level and appears normal. There are no paraspinal abnormalities. Both kidneys demonstrate cortical thinning.  There is prominent cortical lobularity in the lower pole of the left kidney with central low T2 signal.  Contours of the lower pole of the left kidney appear unchanged from the prior CT.  Diverticulum in the second portion of the duodenum is noted.  There are no significant disc space findings from T11-T12 through L1-L2.  There are anterior osteophytes at T11-T12 and T12-L1.  L2-L3:  There is disc bulging asymmetric to the left with mild facet and ligamentous hypertrophy.  There is mild resulting central stenosis.  No nerve root encroachment seen.  L3-L4:  Mild symmetric disc bulging with facet and ligamentous hypertrophy.  There is mild resulting central stenosis.  No foraminal compromise or nerve root encroachment results.  L4-L5:  Mild symmetric disc bulging with facet and ligamentous hypertrophy.  No significant spinal stenosis or nerve root encroachment.  L5-S1:  Disc height and hydration are maintained.  There is mild bilateral facet hypertrophy.  No spinal stenosis or nerve root encroachment.  IMPRESSION:  1.  Mild lower lumbar spondylosis as described.  There is mild central stenosis at L2-L3 and L3-L4. 2.  No focal disc protrusion, foraminal compromise or nerve root encroachment. 3.  No acute osseous findings or malalignment. 4.  Cortical prominence of the lower pole of the left kidney is grossly unchanged from CT of greater than 2 years ago, and therefore unlikely to be clinically significant.  Original Report Authenticated By:  Gerrianne Scale, M.D.   Procedures: None  EKG: Sinus rhythm with 1st degree A-V block with occasional Premature ventricular complexes  Full Code   Hospital Course: See H&P for complete admission details. Julia Maynard is a pleasant 78 year old white female who was brought in by family members with weakness. Her legs felt as if they were giving way. Family was unable to get her up. MRI of his lumbosacral spine showed nothing to explain her symptoms. Sodium was low. She did have an elevated CPK and is on statin which was stopped. Her symptoms are likely related to rhabdomyolysis and hyponatremia. She was started on IV fluids. Her CPK has decreased, sodium normalized. TSH was normal. Her strength has improved. She has been able to ambulate with physical therapy and would benefit from skilled nursing facility. She is agreeable. She should not be started back on statin. Consider alternate, like fish oil. Defer to her primary care physician.  Her blood sugars were uncontrolled. She was on 1 mg of Amaryl 3 times a day. This was changed to 4 mg once daily. She may need further adjustment of her medications. Her blood sugars are running in the 100s currently. Her other medical problems remain stable. Total time on the day of discharge is greater than 30 minutes.  Discharge Exam:  Blood pressure 147/82, pulse 65, temperature 98.3 F (36.8 C), temperature source Oral, resp. rate 18, height 5\' 2"  (1.575 m), weight 85.821 kg (189 lb 3.2 oz), SpO2 95.00%.  Unchanged from 11/09/2011   Signed: Crista Curb L 11/10/2011, 11:16 AM

## 2011-11-10 NOTE — Progress Notes (Signed)
SNF bed confirmed at [facility] for today- family and patient pleased with this location- MD to confirm and finalize d/c. Reece Levy, MSW, [employer] [phone]

## 2011-11-10 NOTE — Progress Notes (Signed)
Patient for d/c today to SNF bed at  [facility] of . Patient agreeable to this plan- plan transfer via EMS. Reece Levy, MSW, [employer] [phone]

## 2011-11-11 NOTE — Progress Notes (Signed)
Clinical Social Work Department CLINICAL SOCIAL WORK PLACEMENT NOTE 11/11/2011  Patient:  Julia Maynard, Julia Maynard  Account Number:  [id number] Admit date:  11/06/2011  Clinical Social Worker:  Reece Levy, Theresia Majors  Date/time:  11/10/2011 01:00 PM  Clinical Social Work is seeking post-discharge placement for this patient at the following level of care:   SKILLED NURSING   (*CSW will update this form in Epic as items are completed)   11/09/2011  Patient/family provided with [facility] Department of Clinical Social Work's list of facilities offering this level of care within the geographic area requested by the patient (or if unable, by the patient's family).  11/09/2011  Patient/family informed of their freedom to choose among providers that offer the needed level of care, that participate in Medicare, Medicaid or managed care program needed by the patient, have an available bed and are willing to accept the patient.  11/09/2011  Patient/family informed of [facility]' ownership interest in [facility], as well as of the fact that they are under no obligation to receive care at this facility.  PASARR submitted to EDS on 11/09/2011 PASARR number received from [employer] on 11/09/2011  FL2 transmitted to all facilities in geographic area requested by pt/family on  11/09/2011 FL2 transmitted to all facilities within larger geographic area on   Patient informed that his/her managed care company has contracts with or will negotiate with  certain facilities, including the following:     Patient/family informed of bed offers received:  11/09/2011 Patient chooses bed at [facility] Physician recommends and patient chooses bed at    Patient to be transferred to [facility] [place] on  11/10/2011 Patient to be transferred to facility by ems  The following physician request were entered in Epic:   Additional Comments:  Reece Levy, MSW, [employer] [phone]

## 2011-11-11 NOTE — Progress Notes (Signed)
Clinical Social Work Department BRIEF PSYCHOSOCIAL ASSESSMENT 11/11/2011  Patient:  Julia Maynard, Julia Maynard     Account Number:  [id number]     Admit date:  11/06/2011  Clinical Social Worker:  Robin Searing  Date/Time:  11/09/2011 04:00 PM  Referred by:  Physician  Date Referred:  11/09/2011 Referred for  SNF Placement   Other Referral:   Interview type:  Patient Other interview type:   daughter at bedside    PSYCHOSOCIAL DATA Living Status:  FAMILY Admitted from facility:   Level of care:   Primary support name:  daughter Primary support relationship to patient:  FAMILY Degree of support available:   minimal    CURRENT CONCERNS Current Concerns  Post-Acute Placement   Other Concerns:   Patient needs SNF at d/c,    SOCIAL WORK ASSESSMENT / PLAN Family interested in [facility] [place]- state patient has been there before. SNF bed secured for 11-10-11   Assessment/plan status:  Other - See comment Other assessment/ plan:   SNF   Information/referral to community resources:    PATIENT'S/FAMILY'S RESPONSE TO PLAN OF CARE: Patient and family agreeable to plans-        Reece Levy, MSW, [employer] [phone]

## 2011-12-16 DIAGNOSIS — R7989 Other specified abnormal findings of blood chemistry: Secondary | ICD-10-CM

## 2011-12-16 HISTORY — DX: Other specified abnormal findings of blood chemistry: R79.89

## 2011-12-16 HISTORY — DX: Hypomagnesemia: E83.42

## 2011-12-22 ENCOUNTER — Encounter (HOSPITAL_COMMUNITY): Payer: Self-pay | Admitting: *Deleted

## 2011-12-22 ENCOUNTER — Inpatient Hospital Stay (HOSPITAL_COMMUNITY): Payer: Medicare Other

## 2011-12-22 ENCOUNTER — Inpatient Hospital Stay (HOSPITAL_COMMUNITY)
Admission: EM | Admit: 2011-12-22 | Discharge: 2011-12-25 | DRG: 684 | Disposition: A | Discharge status: Skilled Nursing Facility | Payer: Medicare Other | Attending: Internal Medicine | Admitting: Internal Medicine

## 2011-12-22 DIAGNOSIS — M6282 Rhabdomyolysis: Secondary | ICD-10-CM

## 2011-12-22 DIAGNOSIS — Z79899 Other long term (current) drug therapy: Secondary | ICD-10-CM

## 2011-12-22 DIAGNOSIS — R112 Nausea with vomiting, unspecified: Secondary | ICD-10-CM

## 2011-12-22 DIAGNOSIS — E039 Hypothyroidism, unspecified: Secondary | ICD-10-CM

## 2011-12-22 DIAGNOSIS — R3989 Other symptoms and signs involving the genitourinary system: Secondary | ICD-10-CM | POA: Diagnosis present

## 2011-12-22 DIAGNOSIS — I10 Essential (primary) hypertension: Secondary | ICD-10-CM

## 2011-12-22 DIAGNOSIS — R7989 Other specified abnormal findings of blood chemistry: Secondary | ICD-10-CM

## 2011-12-22 DIAGNOSIS — Z882 Allergy status to sulfonamides status: Secondary | ICD-10-CM

## 2011-12-22 DIAGNOSIS — N179 Acute kidney failure, unspecified: Principal | ICD-10-CM

## 2011-12-22 DIAGNOSIS — Z888 Allergy status to other drugs, medicaments and biological substances status: Secondary | ICD-10-CM

## 2011-12-22 DIAGNOSIS — R5381 Other malaise: Secondary | ICD-10-CM

## 2011-12-22 DIAGNOSIS — E860 Dehydration: Secondary | ICD-10-CM

## 2011-12-22 DIAGNOSIS — E876 Hypokalemia: Secondary | ICD-10-CM

## 2011-12-22 DIAGNOSIS — N189 Chronic kidney disease, unspecified: Secondary | ICD-10-CM | POA: Diagnosis present

## 2011-12-22 DIAGNOSIS — E871 Hypo-osmolality and hyponatremia: Secondary | ICD-10-CM

## 2011-12-22 DIAGNOSIS — E785 Hyperlipidemia, unspecified: Secondary | ICD-10-CM

## 2011-12-22 DIAGNOSIS — R82998 Other abnormal findings in urine: Secondary | ICD-10-CM | POA: Diagnosis present

## 2011-12-22 DIAGNOSIS — R7402 Elevation of levels of lactic acid dehydrogenase (LDH): Secondary | ICD-10-CM | POA: Diagnosis present

## 2011-12-22 DIAGNOSIS — E119 Type 2 diabetes mellitus without complications: Secondary | ICD-10-CM

## 2011-12-22 DIAGNOSIS — E162 Hypoglycemia, unspecified: Secondary | ICD-10-CM

## 2011-12-22 DIAGNOSIS — E875 Hyperkalemia: Secondary | ICD-10-CM

## 2011-12-22 DIAGNOSIS — R7401 Elevation of levels of liver transaminase levels: Secondary | ICD-10-CM | POA: Diagnosis present

## 2011-12-22 DIAGNOSIS — E1142 Type 2 diabetes mellitus with diabetic polyneuropathy: Secondary | ICD-10-CM | POA: Diagnosis present

## 2011-12-22 DIAGNOSIS — K219 Gastro-esophageal reflux disease without esophagitis: Secondary | ICD-10-CM

## 2011-12-22 DIAGNOSIS — E1140 Type 2 diabetes mellitus with diabetic neuropathy, unspecified: Secondary | ICD-10-CM

## 2011-12-22 DIAGNOSIS — Z7982 Long term (current) use of aspirin: Secondary | ICD-10-CM

## 2011-12-22 DIAGNOSIS — E1149 Type 2 diabetes mellitus with other diabetic neurological complication: Secondary | ICD-10-CM | POA: Diagnosis present

## 2011-12-22 DIAGNOSIS — R8281 Pyuria: Secondary | ICD-10-CM

## 2011-12-22 DIAGNOSIS — I509 Heart failure, unspecified: Secondary | ICD-10-CM | POA: Diagnosis present

## 2011-12-22 DIAGNOSIS — E878 Other disorders of electrolyte and fluid balance, not elsewhere classified: Secondary | ICD-10-CM

## 2011-12-22 DIAGNOSIS — R197 Diarrhea, unspecified: Secondary | ICD-10-CM

## 2011-12-22 DIAGNOSIS — I129 Hypertensive chronic kidney disease with stage 1 through stage 4 chronic kidney disease, or unspecified chronic kidney disease: Secondary | ICD-10-CM | POA: Diagnosis present

## 2011-12-22 HISTORY — DX: Gastro-esophageal reflux disease without esophagitis: K21.9

## 2011-12-22 HISTORY — DX: Type 2 diabetes mellitus with diabetic neuropathy, unspecified: E11.40

## 2011-12-22 HISTORY — DX: Hypothyroidism, unspecified: E03.9

## 2011-12-22 HISTORY — DX: Hypomagnesemia: E83.42

## 2011-12-22 HISTORY — DX: Hyperlipidemia, unspecified: E78.5

## 2011-12-22 HISTORY — DX: Chronic kidney disease, unspecified: N18.9

## 2011-12-22 HISTORY — DX: Abnormal results of liver function studies: R94.5

## 2011-12-22 HISTORY — DX: Gout, unspecified: M10.9

## 2011-12-22 HISTORY — DX: Rhabdomyolysis: M62.82

## 2011-12-22 LAB — BASIC METABOLIC PANEL
BUN: 19 mg/dL (ref 6–23)
Calcium: 7 mg/dL — ABNORMAL LOW (ref 8.4–10.5)
GFR calc non Af Amer: 33 mL/min — ABNORMAL LOW (ref >90)
Glucose, Bld: 155 mg/dL — ABNORMAL HIGH (ref 70–99)
Sodium: 130 mEq/L — ABNORMAL LOW (ref 135–145)

## 2011-12-22 LAB — CARDIAC PANEL(CRET KIN+CKTOT+MB+TROPI)
CK, MB: 6.8 ng/mL (ref 0.3–4.0)
Relative Index: 1 (ref 0.0–2.5)
Troponin I: <0.3 ng/mL (ref <0.30)

## 2011-12-22 LAB — HEPATIC FUNCTION PANEL
AST: 254 U/L — ABNORMAL HIGH (ref 0–37)
Albumin: 3.6 g/dL (ref 3.5–5.2)
Total Bilirubin: 0.9 mg/dL (ref 0.3–1.2)
Total Protein: 7.7 g/dL (ref 6.0–8.3)

## 2011-12-22 LAB — URINALYSIS, ROUTINE W REFLEX MICROSCOPIC
Hgb urine dipstick: NEGATIVE
Specific Gravity, Urine: 1.02 (ref 1.005–1.030)
Urobilinogen, UA: 0.2 mg/dL (ref 0.0–1.0)

## 2011-12-22 LAB — CBC
MCH: 30.7 pg (ref 26.0–34.0)
MCV: 85.7 fL (ref 78.0–100.0)
Platelets: 154 10*3/uL (ref 150–400)
RBC: 4.62 MIL/uL (ref 3.87–5.11)

## 2011-12-22 LAB — GLUCOSE, CAPILLARY: Glucose-Capillary: 108 mg/dL — ABNORMAL HIGH (ref 70–99)

## 2011-12-22 LAB — MAGNESIUM: Magnesium: 0.7 mg/dL — CL (ref 1.5–2.5)

## 2011-12-22 LAB — DIFFERENTIAL
Eosinophils Absolute: 0.1 10*3/uL (ref 0.0–0.7)
Eosinophils Relative: 1 % (ref 0–5)
Lymphs Abs: 1.2 10*3/uL (ref 0.7–4.0)
Monocytes Relative: 8 % (ref 3–12)

## 2011-12-22 MED ORDER — POTASSIUM CHLORIDE IN NACL 40-0.9 MEQ/L-% IV SOLN
INTRAVENOUS | Status: AC
  Filled 2011-12-22: qty 1000

## 2011-12-22 MED ORDER — ONDANSETRON HCL 4 MG PO TABS: 4.0000 mg | ORAL_TABLET | Freq: Four times a day (QID) | ORAL | Status: DC | PRN

## 2011-12-22 MED ORDER — POTASSIUM CHLORIDE 10 MEQ/100ML IV SOLN
10.0000 meq | INTRAVENOUS | Status: AC
  Administered 2011-12-22 (×3): 10 meq via INTRAVENOUS
  Filled 2011-12-22 (×3): qty 100

## 2011-12-22 MED ORDER — INSULIN ASPART 100 UNIT/ML ~~LOC~~ SOLN: 0.0000 [IU] | Freq: Every day | SUBCUTANEOUS | Status: DC

## 2011-12-22 MED ORDER — LEVALBUTEROL HCL 0.63 MG/3ML IN NEBU: 0.6300 mg | INHALATION_SOLUTION | Freq: Four times a day (QID) | RESPIRATORY_TRACT | Status: DC | PRN

## 2011-12-22 MED ORDER — POTASSIUM CHLORIDE IN NACL 40-0.9 MEQ/L-% IV SOLN
INTRAVENOUS | Status: DC
  Administered 2011-12-22 – 2011-12-24 (×4): via INTRAVENOUS
  Filled 2011-12-22 (×7): qty 1000

## 2011-12-22 MED ORDER — SODIUM CHLORIDE 0.9 % IV SOLN
INTRAVENOUS | Status: DC
  Administered 2011-12-22: 14:00:00 via INTRAVENOUS

## 2011-12-22 MED ORDER — MORPHINE SULFATE 2 MG/ML IJ SOLN: 2.0000 mg | INTRAMUSCULAR | Status: DC | PRN

## 2011-12-22 MED ORDER — ALUM & MAG HYDROXIDE-SIMETH 200-200-20 MG/5ML PO SUSP: 30.0000 mL | Freq: Four times a day (QID) | ORAL | Status: DC | PRN

## 2011-12-22 MED ORDER — INSULIN ASPART 100 UNIT/ML ~~LOC~~ SOLN
0.0000 [IU] | Freq: Three times a day (TID) | SUBCUTANEOUS | Status: DC
  Administered 2011-12-23 – 2011-12-24 (×3): 2 [IU] via SUBCUTANEOUS
  Administered 2011-12-25 (×2): 1 [IU] via SUBCUTANEOUS

## 2011-12-22 MED ORDER — ONDANSETRON HCL 4 MG/2ML IJ SOLN: 4.0000 mg | Freq: Four times a day (QID) | INTRAMUSCULAR | Status: DC | PRN

## 2011-12-22 MED ORDER — ONDANSETRON HCL 4 MG/2ML IJ SOLN
4.0000 mg | Freq: Once | INTRAMUSCULAR | Status: AC
  Administered 2011-12-22: 4 mg via INTRAVENOUS
  Filled 2011-12-22: qty 2

## 2011-12-22 MED ORDER — SODIUM CHLORIDE 0.9 % IV BOLUS (SEPSIS)
700.0000 mL | INTRAVENOUS | Status: AC
  Administered 2011-12-22: 14:00:00 via INTRAVENOUS

## 2011-12-22 MED ORDER — ENOXAPARIN SODIUM 40 MG/0.4ML ~~LOC~~ SOLN
40.0000 mg | SUBCUTANEOUS | Status: DC
  Administered 2011-12-22 – 2011-12-24 (×3): 40 mg via SUBCUTANEOUS
  Filled 2011-12-22 (×4): qty 0.4

## 2011-12-22 MED ORDER — ONDANSETRON HCL 4 MG/2ML IJ SOLN
4.0000 mg | Freq: Four times a day (QID) | INTRAMUSCULAR | Status: AC
  Administered 2011-12-22: 4 mg via INTRAVENOUS
  Filled 2011-12-22: qty 2

## 2011-12-22 MED ORDER — MAGNESIUM SULFATE 40 MG/ML IJ SOLN
2.0000 g | Freq: Once | INTRAMUSCULAR | Status: AC
  Administered 2011-12-22: 2 g via INTRAVENOUS
  Filled 2011-12-22: qty 50

## 2011-12-22 MED ORDER — PANTOPRAZOLE SODIUM 40 MG IV SOLR
40.0000 mg | Freq: Every day | INTRAVENOUS | Status: DC
  Administered 2011-12-22 – 2011-12-24 (×3): 40 mg via INTRAVENOUS
  Filled 2011-12-22 (×3): qty 40

## 2011-12-22 MED ORDER — ACETAMINOPHEN 325 MG PO TABS: 650.0000 mg | ORAL_TABLET | Freq: Four times a day (QID) | ORAL | Status: DC | PRN

## 2011-12-22 MED ORDER — SODIUM CHLORIDE 0.9 % IV SOLN: INTRAVENOUS | Status: AC

## 2011-12-22 MED ORDER — ACETAMINOPHEN 650 MG RE SUPP: 650.0000 mg | Freq: Four times a day (QID) | RECTAL | Status: DC | PRN

## 2011-12-22 NOTE — H&P (Signed)
Julia Maynard MRN: [id number] DOB/AGE: [birth year] 78 y.o. Primary Care Physician:COMSTOCK, LLOYD, MD, MD Admit date: 12/22/2011 Chief Complaint: Nausea, vomiting, and diarrhea. HPI: The patient is a 78 year old woman with a history significant for diabetes mellitus, chronic kidney disease, hypertension, and congestive heart failure, who presents to the emergency department from the [facility] with a chief complaint of nausea and vomiting primarily and episodic diarrhea secondarily. The history is being provided by the patient and the patient's daughter, Lorn Junes. The patient was discharged from the hospital in April of 2013 for treatment of rhabdomyolysis and dehydration. She was transferred to the [facility] for rehabilitation. Since that time, she has been diagnosed with at least 2 urinary tract infections. Recently, she was treated with 3 antibiotics in all. The first antibiotic apparently was not effective. The second antibiotic caused her to have nausea and vomiting. The third antibiotic was amoxicillin which she will complete tomorrow. Because of the diarrhea, apparently, she was started on Flagyl. Also, an abdominal ultrasound was ordered by her primary care physician and it revealed no acute findings, specifically, there was a surgically absent gallbladder, the liver parenchyma was normal, and there was no biliary ductal dilatation; there was a possible nonobstructing kidney stone in the left renal pelvis. A pelvic ultrasound was ordered as well. It was essentially negative. Currently, the patient has no complaints of pain with urination. There has been no coffee grounds emesis or bright red blood in her emesis. There has been no black tarry stools or bright red blood per. She has had at least one or 2 diarrheal stools daily for the last 3-4 days. She has had no subjective fever or chills. She has had intermittent abdominal cramping, but only with the diarrhea. Otherwise, she has no complaints of  abdominal pain. She denies fever, chest pain, shortness of breath.  In the emergency department, she is noted to be afebrile and hemodynamically stable. Her lab data are significant for a WBC of 15.1, sodium of 1:30, potassium of 2.6, creatinine of 1.48, calcium of 7.0, glucose of 155, AST of 254, and ALT of 169. Her urinalysis reveals 3-6 WBCs, nitrite, and few bacteria. She is being admitted for further evaluation and management.   Past Medical History  Diagnosis Date  . Hypertension   . CKD (chronic kidney disease)   . Diabetes mellitus   . Urinary tract infection   . CHF (congestive heart failure)   . Hypothyroidism   . GERD (gastroesophageal reflux disease)   . Cataract   . Gout   . Hyperlipidemia   . Rhabdomyolysis And    Past Surgical History  Procedure Date  . Cholecystectomy     Prior to Admission medications   Medication Sig Start Date End Date Taking? Authorizing Provider  acetaminophen (TYLENOL) 325 MG tablet Take 2 tablets (650 mg total) by mouth every 6 (six) hours as needed (or Fever >/= 101). 11/10/11 11/09/12 Yes Christiane Ha, MD  amLODipine (NORVASC) 10 MG tablet Take 10 mg by mouth daily.   Yes Historical Provider, MD  amoxicillin (AMOXIL) 500 MG tablet Take 500 mg by mouth 3 (three) times daily.   Yes Historical Provider, MD  aspirin 81 MG tablet Take 81 mg by mouth daily.   Yes Historical Provider, MD  cholecalciferol (VITAMIN D) 1000 UNITS tablet Take 1,000 Units by mouth daily.   Yes Historical Provider, MD  colchicine 0.6 MG tablet Take 0.6 mg by mouth 2 (two) times daily.    Yes Historical Provider,  MD  ferrous fumarate (HEMOCYTE - 106 MG FE) 325 (106 FE) MG TABS Take 1 tablet by mouth daily.   Yes Historical Provider, MD  glimepiride (AMARYL) 4 MG tablet Take 1 tablet (4 mg total) by mouth daily with breakfast. 11/10/11 11/09/12 Yes Christiane Ha, MD  levothyroxine (SYNTHROID, LEVOTHROID) 75 MCG tablet Take 75 mcg by mouth daily.   Yes Historical  Provider, MD  metoprolol (LOPRESSOR) 50 MG tablet Take 50 mg by mouth 2 (two) times daily.   Yes Historical Provider, MD  metroNIDAZOLE (FLAGYL) 500 MG tablet Take 500 mg by mouth 2 (two) times daily.   Yes Historical Provider, MD  ondansetron (ZOFRAN) 4 MG tablet Take 4 mg by mouth every 8 (eight) hours as needed. Nausea and Vomiting   Yes Historical Provider, MD    Allergies:  Allergies  Allergen Reactions  . Ace Inhibitors Swelling    Tongue swells   . Lasamide (Furosemide) Other (See Comments)    Adds Fluid  . Nitrofuran Derivatives Other (See Comments)    Unknown  . Sulfa Antibiotics Other (See Comments)    Kidney Disease   . Angiotensin Receptor Blockers Rash    No family history on file.  Social History: She is widowed. She is a retired housewife. She has 2 children. She is currently a resident at the [facility] short-term for rehabilitation. She dips snuff. She denies smoking tobacco, alcohol, and illicit drug use. She is ambulating mostly via wheelchair until she becomes more ambulatory with physical therapy.         ROS: As above in history present illness, otherwise negative.  PHYSICAL EXAM: Blood pressure 139/63, pulse 68, temperature 98.4 F (36.9 C), temperature source Oral, resp. rate 16, height 5\' 2"  (1.575 m), weight 80.287 kg (177 lb), SpO2 99.00%.  General: Obese 78 year old Caucasian woman lying in bed, in no acute distress. HEENT: Head is normocephalic, nontraumatic. Pupils are equal, round, and reactive to light. Extraocular movements are intact. Conjunctivae are clear. Sclerae are white. Oropharynx reveals mildly dry mucous membranes. No posterior exudates or erythema. No teeth. Neck: Supple, no adenopathy, no thyromegaly. Lungs: Clear anteriorly with decreased breath sounds in the bases. Breathing is nonlabored. Heart: S1, S2, with a soft systolic murmur. Abdomen: Positive bowel sounds, soft, obese, nontender, nondistended. No masses  palpated. Extremities: Pedal pulses palpable. No pretibial edema. No pedal edema. Neurologic: She is alert and oriented x2. Cranial nerves II through XII are grossly intact. Psychiatric: She is alert and oriented x3. She has a pleasant affect.. Her speech is clear.  Basic Metabolic Panel:  Basename 12/22/11 1306  NA 130*  K 2.6*  CL 90*  CO2 20  GLUCOSE 155*  BUN 19  CREATININE 1.48*  CALCIUM 7.0*  MG --  PHOS --   Liver Function Tests:  Basename 12/22/11 1306  AST 254*  ALT 169*  ALKPHOS 46  BILITOT 0.9  PROT 7.7  ALBUMIN 3.6    Basename 12/22/11 1306  LIPASE 25  AMYLASE --   No results found for this basename: AMMONIA:2 in the last 72 hours CBC:  Basename 12/22/11 1306  WBC 15.1*  NEUTROABS 12.6*  HGB 14.2  HCT 39.6  MCV 85.7  PLT 154   Cardiac Enzymes: No results found for this basename: CKTOTAL:3,CKMB:3,CKMBINDEX:3,TROPONINI:3 in the last 72 hours BNP: No results found for this basename: PROBNP:3 in the last 72 hours D-Dimer: No results found for this basename: DDIMER:2 in the last 72 hours CBG:  Basename 12/22/11 1248  GLUCAP 153*   Hemoglobin A1C: No results found for this basename: HGBA1C in the last 72 hours Fasting Lipid Panel: No results found for this basename: CHOL,HDL,LDLCALC,TRIG,CHOLHDL,LDLDIRECT in the last 72 hours Thyroid Function Tests: No results found for this basename: TSH,T4TOTAL,FREET4,T3FREE,THYROIDAB in the last 72 hours Anemia Panel: No results found for this basename: VITAMINB12,FOLATE,FERRITIN,TIBC,IRON,RETICCTPCT in the last 72 hours Coagulation: No results found for this basename: LABPROT:2,INR:2 in the last 72 hours Urine Drug Screen: Drugs of Abuse  No results found for this basename: labopia, cocainscrnur, labbenz, amphetmu, thcu, labbarb    Alcohol Level: No results found for this basename: ETH:2 in the last 72 hours Urinalysis:  Basename 12/22/11 1306  COLORURINE AMBER*  LABSPEC 1.020  PHURINE 6.0   GLUCOSEU NEGATIVE  HGBUR NEGATIVE  BILIRUBINUR SMALL*  KETONESUR NEGATIVE  PROTEINUR 30*  UROBILINOGEN 0.2  NITRITE POSITIVE*  LEUKOCYTESUR NEGATIVE   Misc. Labs:   EKG pending   No results found for this or any previous visit (from the past 240 hour(s)).   Results for orders placed during the hospital encounter of 12/22/11 (from the past 48 hour(s))  GLUCOSE, CAPILLARY     Status: Abnormal   Collection Time   12/22/11 12:48 PM      Component Value Range Comment   Glucose-Capillary 153 (*) 70 - 99 (mg/dL)   URINALYSIS, ROUTINE W REFLEX MICROSCOPIC     Status: Abnormal   Collection Time   12/22/11  1:06 PM      Component Value Range Comment   Color, Urine AMBER (*) YELLOW  BIOCHEMICALS MAY BE AFFECTED BY COLOR   APPearance CLEAR  CLEAR     Specific Gravity, Urine 1.020  1.005 - 1.030     pH 6.0  5.0 - 8.0     Glucose, UA NEGATIVE  NEGATIVE (mg/dL)    Hgb urine dipstick NEGATIVE  NEGATIVE     Bilirubin Urine SMALL (*) NEGATIVE     Ketones, ur NEGATIVE  NEGATIVE (mg/dL)    Protein, ur 30 (*) NEGATIVE (mg/dL)    Urobilinogen, UA 0.2  0.0 - 1.0 (mg/dL)    Nitrite POSITIVE (*) NEGATIVE     Leukocytes, UA NEGATIVE  NEGATIVE    CBC     Status: Abnormal   Collection Time   12/22/11  1:06 PM      Component Value Range Comment   WBC 15.1 (*) 4.0 - 10.5 (K/uL)    RBC 4.62  3.87 - 5.11 (MIL/uL)    Hemoglobin 14.2  12.0 - 15.0 (g/dL)    HCT 16.1  09.6 - 04.5 (%)    MCV 85.7  78.0 - 100.0 (fL)    MCH 30.7  26.0 - 34.0 (pg)    MCHC 35.9  30.0 - 36.0 (g/dL)    RDW 40.9  81.1 - 91.4 (%)    Platelets 154  150 - 400 (K/uL)   DIFFERENTIAL     Status: Abnormal   Collection Time   12/22/11  1:06 PM      Component Value Range Comment   Neutrophils Relative 83 (*) 43 - 77 (%)    Neutro Abs 12.6 (*) 1.7 - 7.7 (K/uL)    Lymphocytes Relative 8 (*) 12 - 46 (%)    Lymphs Abs 1.2  0.7 - 4.0 (K/uL)    Monocytes Relative 8  3 - 12 (%)    Monocytes Absolute 1.2 (*) 0.1 - 1.0 (K/uL)     Eosinophils Relative 1  0 - 5 (%)  Eosinophils Absolute 0.1  0.0 - 0.7 (K/uL)    Basophils Relative 0  0 - 1 (%)    Basophils Absolute 0.0  0.0 - 0.1 (K/uL)   BASIC METABOLIC PANEL     Status: Abnormal   Collection Time   12/22/11  1:06 PM      Component Value Range Comment   Sodium 130 (*) 135 - 145 (mEq/L)    Potassium 2.6 (*) 3.5 - 5.1 (mEq/L)    Chloride 90 (*) 96 - 112 (mEq/L)    CO2 20  19 - 32 (mEq/L)    Glucose, Bld 155 (*) 70 - 99 (mg/dL)    BUN 19  6 - 23 (mg/dL)    Creatinine, Ser 1.61 (*) 0.50 - 1.10 (mg/dL)    Calcium 7.0 (*) 8.4 - 10.5 (mg/dL)    GFR calc non Af Amer 33 (*) >90 (mL/min)    GFR calc Af Amer 38 (*) >90 (mL/min)   URINE MICROSCOPIC-ADD ON     Status: Abnormal   Collection Time   12/22/11  1:06 PM      Component Value Range Comment   Squamous Epithelial / LPF RARE  RARE     WBC, UA 3-6  <3 (WBC/hpf)    RBC / HPF 0-2  <3 (RBC/hpf)    Bacteria, UA FEW (*) RARE     Urine-Other FEW YEAST     LIPASE, BLOOD     Status: Normal   Collection Time   12/22/11  1:06 PM      Component Value Range Comment   Lipase 25  11 - 59 (U/L)   HEPATIC FUNCTION PANEL     Status: Abnormal   Collection Time   12/22/11  1:06 PM      Component Value Range Comment   Total Protein 7.7  6.0 - 8.3 (g/dL)    Albumin 3.6  3.5 - 5.2 (g/dL)    AST 096 (*) 0 - 37 (U/L)    ALT 169 (*) 0 - 35 (U/L)    Alkaline Phosphatase 46  39 - 117 (U/L)    Total Bilirubin 0.9  0.3 - 1.2 (mg/dL)    Bilirubin, Direct 0.4 (*) 0.0 - 0.3 (mg/dL)    Indirect Bilirubin 0.5  0.3 - 0.9 (mg/dL)     Dg Chest Port 1 View  12/22/2011  *RADIOLOGY REPORT*  Clinical Data: Weakness  PORTABLE CHEST - 1 VIEW  Comparison: Chest radiograph 01/13/2009  Findings: Stable cardiomegaly.  Atherosclerotic calcification of the thoracic aortic arch.  Pulmonary vascularity is normal.  The lungs are clear.  No visible airspace disease or pleural effusion.  Negative for pneumothorax.  Remote, healed lower left rib fracture  deformities.  No acute bony abnormality identified.  Bones are osteopenic.  IMPRESSION: No acute cardiopulmonary disease.  Stable cardiomegaly.  Original Report Authenticated By: Britta Mccreedy, M.D.    Impression:  Principal Problem:  *Nausea and vomiting Active Problems:  Diarrhea  HTN (hypertension)  Type II or unspecified type diabetes mellitus without mention of complication, not stated as uncontrolled  Hypothyroid  GERD (gastroesophageal reflux disease)  Debility  Dehydration  Hypokalemia  Elevated LFTs  Pyuria  Acute renal failure  1. Nausea, vomiting, and diarrhea. This may be an acute gastroenteritis. However, antibiotic induced gastrointestinal symptomatology is also possible, given the multiple antibiotics she's been on over the past couple weeks. C. difficile colitis is also a possibility.  2. Dehydration as manifested by acute renal failure and hyponatremia.  3. Hypokalemia,  likely secondary to vomiting and diarrhea. Rule out magnesium deficiency.  4. Elevated LFTs. Her abdomen is more or less benign. The outpatient ultrasound revealed a surgically absent gallbladder and no significant findings. The elevated liver transaminases could be secondary to dehydration. Her LFTs were also elevated in April 2013 with elevation could have been secondary to rhabdo.  5. Pyuria without dysuria. We'll hold off on starting another antibiotic.   6. Type 2 diabetes mellitus. Per history, she has had blood glucoses in the 60s at the skilled nursing facility according to her family.  7. Hypertension. Currently stable. She is to with amlodipine and metoprolol chronically.   Plan: 1. The patient received 3 IV runs of potassium chloride in the emergency department. We'll continue potassium chloride repletion in the maintenance IV fluids. 2. We'll make the patient virtually n.p.o. 3. Will start IV Protonix empirically. We'll also order as needed Zofran for nausea and vomiting. 4. Hold  Amaryl and start sliding scale NovoLog. 5. Discontinue amoxicillin and Flagyl for now. 6. For further evaluation, we'll order a urine culture, C. difficile by PCR, magnesium level, proBNP, and thyroid function. We'll also order one set of cardiac enzymes and EKG. Also order a viral hepatitis panel. 7. Will consider radiographic diagnostic workup if the patient does not improve with management above.     Antwonette Feliz 12/22/2011, 5:31 PM

## 2011-12-22 NOTE — ED Notes (Signed)
CRITICAL VALUE ALERT  Critical value received:  K+ 2.6  Date of notification:  12/22/2011  Time of notification:  1410  Critical value read back: yes  Nurse who received alert:  A. Dareen Piano, RN  MD notified (1st page):  I. Lynelle Doctor  Time of first page:  1415  MD notified (2nd page):  Time of second page:  Responding MD:    Time MD responded:

## 2011-12-22 NOTE — ED Notes (Signed)
Arrives via [facility] from [facility] with c/o n/v x 1 week; family member reported to EMS that pt has c/o feeling nervous and unable to keep anything down x 1 week. A&ox4; denies pain. States last vomited this morning after drinking gingerale this morning.

## 2011-12-22 NOTE — ED Notes (Signed)
Attempted to call report to floor. Awaiting return call.

## 2011-12-22 NOTE — ED Notes (Signed)
Denies nausea presently.

## 2011-12-22 NOTE — ED Provider Notes (Signed)
History     CSN: [id number]  Arrival date & time 12/22/11  1235   First MD Initiated Contact with Patient 12/22/11 1328      Chief Complaint  Patient presents with  . Emesis    (Consider location/radiation/quality/duration/timing/severity/associated sxs/prior treatment) HPI  Patient relates for the past 2 weeks she's had nausea and vomiting 3 times a day. She's also had episode of loose diarrhea about once a day. She is started having low blood sugars the past week and had an episode 6 days ago and then also earlier today. Patient states today she woke up and had people standing over her and told her her blood sugar had gotten down into the 60s. She denies any abdominal pain or fever. Her family relates she's been on it antibiotics for 3 courses recently for UTI and has had frequent UTIs in the past 4 years. She's also being followed for chronic renal insufficiency and has proteinuria. They relate she had ultrasound of her abdomen and pelvis done yesterday at the [facility] but they do not know the  results yet.  PCP Dr. Jorene Guest Urologist Dr. Jerre Simon Nephrologist Dr. Kristian Covey  Past Medical History  Diagnosis Date  . Hypertension   . Renal disorder   . Diabetes mellitus   . Urinary tract infection   . CHF (congestive heart failure)   . Thyroid disease   . Reflux   . Cataract   . Gout   . Hyperlipidemia     Past Surgical History  Procedure Date  . Cholecystectomy     No family history on file.  History  Substance Use Topics  . Smoking status: Never Smoker   . Smokeless tobacco: Current User    Types: Snuff  . Alcohol Use: No  Was living at home until 5 weeks ago when admitted for rhabdomyolosis, in [facility] for rehab to get her strength improved so she can ambulate again  OB History    Grav Para Term Preterm Abortions TAB SAB Ect Mult Living                  Review of Systems  All other systems reviewed and are negative.    Allergies  Ace inhibitors;  Lasamide; Nitrofuran derivatives; Sulfa antibiotics; and Angiotensin receptor blockers  Home Medications   Current Outpatient Rx  Name Route Sig Dispense Refill  . ACETAMINOPHEN 325 MG PO TABS Oral Take 2 tablets (650 mg total) by mouth every 6 (six) hours as needed (or Fever >/= 101).    . AMLODIPINE BESYLATE 10 MG PO TABS Oral Take 10 mg by mouth daily.    . AMOXICILLIN 500 MG PO TABS Oral Take 500 mg by mouth 3 (three) times daily.    . ASPIRIN 81 MG PO TABS Oral Take 81 mg by mouth daily.    Marland Kitchen VITAMIN D 1000 UNITS PO TABS Oral Take 1,000 Units by mouth daily.    . COLCHICINE 0.6 MG PO TABS Oral Take 0.6 mg by mouth 2 (two) times daily.     Marland Kitchen FERROUS FUMARATE 325 (106 FE) MG PO TABS Oral Take 1 tablet by mouth daily.    Marland Kitchen GLIMEPIRIDE 4 MG PO TABS Oral Take 1 tablet (4 mg total) by mouth daily with breakfast.    . LEVOTHYROXINE SODIUM 75 MCG PO TABS Oral Take 75 mcg by mouth daily.    Marland Kitchen METOPROLOL TARTRATE 50 MG PO TABS Oral Take 50 mg by mouth 2 (two) times daily.    Marland Kitchen  METRONIDAZOLE 500 MG PO TABS Oral Take 500 mg by mouth 2 (two) times daily.    Marland Kitchen ONDANSETRON HCL 4 MG PO TABS Oral Take 4 mg by mouth every 8 (eight) hours as needed. Nausea and Vomiting      BP 144/67  Pulse 75  Temp(Src) 98 F (36.7 C) (Oral)  Resp 18  Ht 5\' 2"  (1.575 m)  Wt 177 lb (80.287 kg)  BMI 32.37 kg/m2  SpO2 98%  Vital signs normal    Physical Exam  Constitutional: She appears well-developed and well-nourished. She is cooperative.  HENT:  Head: Normocephalic and atraumatic.  Right Ear: External ear normal.  Left Ear: External ear normal.       Dry mouth  Eyes: Conjunctivae and EOM are normal. Pupils are equal, round, and reactive to light.  Neck: Normal range of motion and full passive range of motion without pain. Neck supple.  Cardiovascular: Normal rate, regular rhythm and normal heart sounds.   Pulmonary/Chest: Effort normal and breath sounds normal. Not tachypneic. No respiratory distress.    Abdominal: Soft. Bowel sounds are normal. She exhibits no distension and no mass. There is no tenderness. There is no rebound and no guarding.  Musculoskeletal:       Patellar reflexes +2 and = bilaterally SLR neg bilaterally  Neurological: She has normal strength and normal reflexes. No cranial nerve deficit.  Skin: Skin is warm, dry and intact. There is pallor.  Psychiatric: She has a normal mood and affect. Her speech is normal and behavior is normal. Thought content normal.    ED Course  Procedures (including critical care time)   Medications  0.9 %  sodium chloride infusion (  Intravenous New Bag/Given 12/22/11 1425)  potassium chloride 10 mEq in 100 mL IVPB (10 mEq Intravenous New Bag/Given 12/22/11 1606)  sodium chloride 0.9 % bolus 700 mL ( mL Intravenous Given 12/22/11 1425)  ondansetron (ZOFRAN) injection 4 mg (4 mg Intravenous Given 12/22/11 1424)   We have been waiting for [place] report from yesterday to be faxed from [facility]. I talked to Dr Tyron Russell and he checked, it is not in our PACS system.  16:16 US pelvis negative post menopausal  [place] abd, surgically absent gallbladder, CBD not dilated, small left renal cyst and small left nonobstructing renal stone.   Pt given a bolus and slow hydration b/o hx of CHF.   Pt was on cholesterol medications until about 5 weeks ago when she was admitted for rhabdomyolosis  16:33 Dr Sherrie Mustache, admit to tele, request CXR be done.   Results for orders placed during the hospital encounter of 12/22/11  GLUCOSE, CAPILLARY      Component Value Range   Glucose-Capillary 153 (*) 70 - 99 (mg/dL)  URINALYSIS, ROUTINE W REFLEX MICROSCOPIC      Component Value Range   Color, Urine AMBER (*) YELLOW    APPearance CLEAR  CLEAR    Specific Gravity, Urine 1.020  1.005 - 1.030    pH 6.0  5.0 - 8.0    Glucose, UA NEGATIVE  NEGATIVE (mg/dL)   Hgb urine dipstick NEGATIVE  NEGATIVE    Bilirubin Urine SMALL (*) NEGATIVE    Ketones, ur NEGATIVE  NEGATIVE (mg/dL)    Protein, ur 30 (*) NEGATIVE (mg/dL)   Urobilinogen, UA 0.2  0.0 - 1.0 (mg/dL)   Nitrite POSITIVE (*) NEGATIVE    Leukocytes, UA NEGATIVE  NEGATIVE   CBC      Component Value Range   WBC 15.1 (*)  4.0 - 10.5 (K/uL)   RBC 4.62  3.87 - 5.11 (MIL/uL)   Hemoglobin 14.2  12.0 - 15.0 (g/dL)   HCT 16.1  09.6 - 04.5 (%)   MCV 85.7  78.0 - 100.0 (fL)   MCH 30.7  26.0 - 34.0 (pg)   MCHC 35.9  30.0 - 36.0 (g/dL)   RDW 40.9  81.1 - 91.4 (%)   Platelets 154  150 - 400 (K/uL)  DIFFERENTIAL      Component Value Range   Neutrophils Relative 83 (*) 43 - 77 (%)   Neutro Abs 12.6 (*) 1.7 - 7.7 (K/uL)   Lymphocytes Relative 8 (*) 12 - 46 (%)   Lymphs Abs 1.2  0.7 - 4.0 (K/uL)   Monocytes Relative 8  3 - 12 (%)   Monocytes Absolute 1.2 (*) 0.1 - 1.0 (K/uL)   Eosinophils Relative 1  0 - 5 (%)   Eosinophils Absolute 0.1  0.0 - 0.7 (K/uL)   Basophils Relative 0  0 - 1 (%)   Basophils Absolute 0.0  0.0 - 0.1 (K/uL)  BASIC METABOLIC PANEL      Component Value Range   Sodium 130 (*) 135 - 145 (mEq/L)   Potassium 2.6 (*) 3.5 - 5.1 (mEq/L)   Chloride 90 (*) 96 - 112 (mEq/L)   CO2 20  19 - 32 (mEq/L)   Glucose, Bld 155 (*) 70 - 99 (mg/dL)   BUN 19  6 - 23 (mg/dL)   Creatinine, Ser 7.82 (*) 0.50 - 1.10 (mg/dL)   Calcium 7.0 (*) 8.4 - 10.5 (mg/dL)   GFR calc non Af Amer 33 (*) >90 (mL/min)   GFR calc Af Amer 38 (*) >90 (mL/min)  URINE MICROSCOPIC-ADD ON      Component Value Range   Squamous Epithelial / LPF RARE  RARE    WBC, UA 3-6  <3 (WBC/hpf)   RBC / HPF 0-2  <3 (RBC/hpf)   Bacteria, UA FEW (*) RARE    Urine-Other FEW YEAST    LIPASE, BLOOD      Component Value Range   Lipase 25  11 - 59 (U/L)  HEPATIC FUNCTION PANEL      Component Value Range   Total Protein 7.7  6.0 - 8.3 (g/dL)   Albumin 3.6  3.5 - 5.2 (g/dL)   AST 956 (*) 0 - 37 (U/L)   ALT 169 (*) 0 - 35 (U/L)   Alkaline Phosphatase 46  39 - 117 (U/L)   Total Bilirubin 0.9  0.3 - 1.2 (mg/dL)   Bilirubin, Direct 0.4 (*) 0.0 - 0.3  (mg/dL)   Indirect Bilirubin 0.5  0.3 - 0.9 (mg/dL)   Laboratory interpretation all normal except hypokalemia, hyponatremia, low chloride, hyperglycemia, renal insufficiency, elevated liver function tests without elevated lipase    1. Intractable nausea and vomiting   2. Diarrhea   3. Hypokalemia   4. Hyponatremia   5. Chloride, decreased level   6. Elevated LFTs   7. Hypoglycemia     Plan admission  Devoria Albe, MD, FACEP   MDM          Ward Givens, MD 12/22/11 [phone]

## 2011-12-23 ENCOUNTER — Encounter (HOSPITAL_COMMUNITY): Payer: Self-pay | Admitting: Internal Medicine

## 2011-12-23 DIAGNOSIS — E1140 Type 2 diabetes mellitus with diabetic neuropathy, unspecified: Secondary | ICD-10-CM

## 2011-12-23 DIAGNOSIS — E871 Hypo-osmolality and hyponatremia: Secondary | ICD-10-CM

## 2011-12-23 DIAGNOSIS — E876 Hypokalemia: Secondary | ICD-10-CM

## 2011-12-23 HISTORY — DX: Type 2 diabetes mellitus with diabetic neuropathy, unspecified: E11.40

## 2011-12-23 LAB — BASIC METABOLIC PANEL
Chloride: 101 mEq/L (ref 96–112)
GFR calc non Af Amer: 45 mL/min — ABNORMAL LOW (ref >90)
Glucose, Bld: 180 mg/dL — ABNORMAL HIGH (ref 70–99)
Potassium: 2.8 mEq/L — ABNORMAL LOW (ref 3.5–5.1)
Sodium: 136 mEq/L (ref 135–145)

## 2011-12-23 LAB — HEMOGLOBIN A1C
Hgb A1c MFr Bld: 7.3 % — ABNORMAL HIGH (ref <5.7)
Mean Plasma Glucose: 163 mg/dL — ABNORMAL HIGH (ref <117)

## 2011-12-23 LAB — GLUCOSE, CAPILLARY
Glucose-Capillary: 138 mg/dL — ABNORMAL HIGH (ref 70–99)
Glucose-Capillary: 169 mg/dL — ABNORMAL HIGH (ref 70–99)
Glucose-Capillary: 139 mg/dL — ABNORMAL HIGH (ref 70–99)

## 2011-12-23 LAB — HEPATIC FUNCTION PANEL
ALT: 132 U/L — ABNORMAL HIGH (ref 0–35)
AST: 190 U/L — ABNORMAL HIGH (ref 0–37)
Bilirubin, Direct: 0.4 mg/dL — ABNORMAL HIGH (ref 0.0–0.3)
Total Protein: 6.4 g/dL (ref 6.0–8.3)

## 2011-12-23 LAB — HIGH SENSITIVITY CRP: CRP, High Sensitivity: 29.3 mg/L — ABNORMAL HIGH

## 2011-12-23 LAB — CBC
Hemoglobin: 12.1 g/dL (ref 12.0–15.0)
MCH: 30.5 pg (ref 26.0–34.0)
RBC: 3.97 MIL/uL (ref 3.87–5.11)

## 2011-12-23 LAB — CARDIAC PANEL(CRET KIN+CKTOT+MB+TROPI): Troponin I: <0.3 ng/mL (ref <0.30)

## 2011-12-23 MED ORDER — MAGNESIUM SULFATE 50 % IJ SOLN: 2.0000 g | Freq: Once | INTRAVENOUS | Status: DC

## 2011-12-23 MED ORDER — GABAPENTIN 100 MG PO CAPS
100.0000 mg | ORAL_CAPSULE | Freq: Every day | ORAL | Status: DC
  Administered 2011-12-24: 100 mg via ORAL
  Filled 2011-12-23 (×2): qty 1

## 2011-12-23 MED ORDER — MAGNESIUM SULFATE 40 MG/ML IJ SOLN
2.0000 g | Freq: Once | INTRAMUSCULAR | Status: AC
  Administered 2011-12-23: 2 g via INTRAVENOUS
  Filled 2011-12-23: qty 50

## 2011-12-23 MED ORDER — CALCIUM CARBONATE-VITAMIN D 500-200 MG-UNIT PO TABS
1.0000 | ORAL_TABLET | Freq: Three times a day (TID) | ORAL | Status: DC
  Administered 2011-12-23 – 2011-12-25 (×8): 1 via ORAL
  Filled 2011-12-23 (×8): qty 1

## 2011-12-23 MED ORDER — POTASSIUM CHLORIDE CRYS ER 20 MEQ PO TBCR
20.0000 meq | EXTENDED_RELEASE_TABLET | Freq: Three times a day (TID) | ORAL | Status: DC
  Administered 2011-12-23 – 2011-12-24 (×6): 20 meq via ORAL
  Filled 2011-12-23 (×6): qty 1

## 2011-12-23 MED ORDER — POTASSIUM CHLORIDE 10 MEQ/100ML IV SOLN
10.0000 meq | INTRAVENOUS | Status: AC
Start: 2011-12-23 — End: 2011-12-23
  Administered 2011-12-23 (×3): 10 meq via INTRAVENOUS
  Filled 2011-12-23 (×3): qty 100

## 2011-12-23 MED ORDER — AMLODIPINE BESYLATE 5 MG PO TABS
10.0000 mg | ORAL_TABLET | Freq: Every day | ORAL | Status: DC
  Administered 2011-12-23 – 2011-12-25 (×3): 10 mg via ORAL
  Filled 2011-12-23 (×3): qty 2

## 2011-12-23 MED ORDER — LEVOTHYROXINE SODIUM 75 MCG PO TABS
75.0000 ug | ORAL_TABLET | Freq: Every day | ORAL | Status: DC
  Administered 2011-12-24 – 2011-12-25 (×2): 75 ug via ORAL
  Filled 2011-12-23 (×2): qty 1

## 2011-12-23 MED ORDER — METOPROLOL TARTRATE 50 MG PO TABS
50.0000 mg | ORAL_TABLET | Freq: Two times a day (BID) | ORAL | Status: DC
  Administered 2011-12-23 – 2011-12-25 (×5): 50 mg via ORAL
  Filled 2011-12-23 (×5): qty 1

## 2011-12-23 NOTE — Progress Notes (Signed)
CRITICAL VALUE ALERT  Critical value received:  CKMB of 6.8 and  mag of 0.7  Date of notification:  12/22/2011  Time of notification:  1915  Critical value read back:yes  Nurse who received alert:  Alisia Ferrari, RN  MD notified (1st page): Rito Ehrlich  Time of first page:  1945  MD notified (2nd page):  Time of second page:  Responding MD:  Rito Ehrlich  Time MD responded:  Wrote orders in the computer

## 2011-12-23 NOTE — Progress Notes (Signed)
Subjective: The patient has no complaints of abdominal pain, nausea, vomiting, or diarrhea. She complains of a burning sensation in her feet. She's had this off and on for months.  Objective: Vital signs in last 24 hours: Filed Vitals:   12/22/11 1844 12/22/11 1935 12/22/11 2120 12/23/11 0606  BP: 148/74  159/72 157/74  Pulse: 72 75 74 85  Temp: 98.5 F (36.9 C)  97.5 F (36.4 C) 97.3 F (36.3 C)  TempSrc: Oral  Oral   Resp: 20 16 16 20   Height: 5\' 2"  (1.575 m)     Weight: 81 kg (178 lb 9.2 oz)   83.3 kg (183 lb 10.3 oz)  SpO2: 96% 95% 97% 97%    Intake/Output Summary (Last 24 hours) at 12/23/11 0853 Last data filed at 12/23/11 0725  Gross per 24 hour  Intake 943.33 ml  Output   1250 ml  Net -306.67 ml    Weight change:   Physical exam: Lungs: Decreased breath sounds in the bases, otherwise clear. Heart: S1, S2, with a soft systolic murmur. Abdomen: Positive bowel sounds, obese, nontender, nondistended. Extremities: No pedal edema.  Lab Results: Basic Metabolic Panel:  Basename 12/23/11 0555 12/22/11 1731 12/22/11 1306  NA 136 -- 130*  K 2.8* -- 2.6*  CL 101 -- 90*  CO2 20 -- 20  GLUCOSE 180* -- 155*  BUN 11 -- 19  CREATININE 1.13* -- 1.48*  CALCIUM 6.7* -- 7.0*  MG 1.3* 0.7* --  PHOS -- -- --   Liver Function Tests:  Basename 12/23/11 0555 12/22/11 1306  AST 190* 254*  ALT 132* 169*  ALKPHOS 40 46  BILITOT 0.7 0.9  PROT 6.4 7.7  ALBUMIN 3.0* 3.6    Basename 12/22/11 1306  LIPASE 25  AMYLASE --   No results found for this basename: AMMONIA:2 in the last 72 hours CBC:  Basename 12/23/11 0555 12/22/11 1306  WBC 12.4* 15.1*  NEUTROABS -- 12.6*  HGB 12.1 14.2  HCT 34.3* 39.6  MCV 86.4 85.7  PLT 245 154   Cardiac Enzymes:  Basename 12/23/11 0555 12/22/11 1731  CKTOTAL 813* 683*  CKMB 6.9* 6.8*  CKMBINDEX -- --  TROPONINI <0.30 <0.30   BNP:  Basename 12/22/11 1731  PROBNP 1481.0*   D-Dimer: No results found for this basename:  DDIMER:2 in the last 72 hours CBG:  Basename 12/23/11 0719 12/23/11 0334 12/22/11 2120 12/22/11 1748 12/22/11 1248  GLUCAP 169* 138* 110* 108* 153*   Hemoglobin A1C:  Basename 12/22/11 1731  HGBA1C 7.3*   Fasting Lipid Panel: No results found for this basename: CHOL,HDL,LDLCALC,TRIG,CHOLHDL,LDLDIRECT in the last 72 hours Thyroid Function Tests:  Basename 12/22/11 1731  TSH 0.153*  T4TOTAL --  FREET4 1.50  T3FREE --  THYROIDAB --   Anemia Panel: No results found for this basename: VITAMINB12,FOLATE,FERRITIN,TIBC,IRON,RETICCTPCT in the last 72 hours Coagulation: No results found for this basename: LABPROT:2,INR:2 in the last 72 hours Urine Drug Screen: Drugs of Abuse  No results found for this basename: labopia, cocainscrnur, labbenz, amphetmu, thcu, labbarb    Alcohol Level: No results found for this basename: ETH:2 in the last 72 hours Urinalysis:  Basename 12/22/11 1306  COLORURINE AMBER*  LABSPEC 1.020  PHURINE 6.0  GLUCOSEU NEGATIVE  HGBUR NEGATIVE  BILIRUBINUR SMALL*  KETONESUR NEGATIVE  PROTEINUR 30*  UROBILINOGEN 0.2  NITRITE POSITIVE*  LEUKOCYTESUR NEGATIVE   Misc. Labs:   Micro: No results found for this or any previous visit (from the past 240 hour(s)).  Studies/Results: Dg Chest [employer]  1 View  12/22/2011  *RADIOLOGY REPORT*  Clinical Data: Weakness  PORTABLE CHEST - 1 VIEW  Comparison: Chest radiograph 01/13/2009  Findings: Stable cardiomegaly.  Atherosclerotic calcification of the thoracic aortic arch.  Pulmonary vascularity is normal.  The lungs are clear.  No visible airspace disease or pleural effusion.  Negative for pneumothorax.  Remote, healed lower left rib fracture deformities.  No acute bony abnormality identified.  Bones are osteopenic.  IMPRESSION: No acute cardiopulmonary disease.  Stable cardiomegaly.  Original Report Authenticated By: Britta Mccreedy, M.D.    Medications:  Scheduled:   . sodium chloride   Intravenous STAT  . amLODipine   10 mg Oral Daily  . calcium-vitamin D  1 tablet Oral TID  . enoxaparin  40 mg Subcutaneous Q24H  . gabapentin  100 mg Oral QHS  . insulin aspart  0-5 Units Subcutaneous QHS  . insulin aspart  0-9 Units Subcutaneous TID WC  . levothyroxine  75 mcg Oral QAC breakfast  . magnesium sulfate LVP 250-500 ml  2 g Intravenous Once  . magnesium sulfate 1 - 4 g bolus IVPB  2 g Intravenous Once  . metoprolol tartrate  50 mg Oral BID  . ondansetron  4 mg Intravenous Once  . ondansetron (ZOFRAN) IV  4 mg Intravenous Q6H  . pantoprazole (PROTONIX) IV  40 mg Intravenous Daily  . potassium chloride  10 mEq Intravenous Q1 Hr x 3  . potassium chloride  10 mEq Intravenous Q1 Hr x 3  . potassium chloride  20 mEq Oral TID  . sodium chloride  700 mL Intravenous STAT   Continuous:   . 0.9 % NaCl with KCl 40 mEq / L 100 mL/hr at 12/22/11 2034  . DISCONTD: sodium chloride 50 mL/hr at 12/22/11 1425   RUE:[id number], acetaminophen, alum & mag hydroxide-simeth, levalbuterol, morphine injection, ondansetron (ZOFRAN) IV, ondansetron  Assessment: Principal Problem:  *Nausea and vomiting Active Problems:  Diarrhea  HTN (hypertension)  Type II or unspecified type diabetes mellitus without mention of complication, not stated as uncontrolled  Hypothyroid  GERD (gastroesophageal reflux disease)  Debility  Dehydration  Hypokalemia  Elevated LFTs  Pyuria  Acute renal failure  Hypocalcemia  Hypomagnesemia   1. Nausea, vomiting, and diarrhea. Possibly secondary to acute gastroenteritis versus side effects from multiple antibiotics. Symptoms have subsided and nearly resolved.  Electrolyte abnormalities including hypokalemia, hypomagnesemia, and hypocalcemia. Hyponatremia has resolved. She received 2 g of magnesium sulfate IV overnight. The hypomagnesemia could be causing hypocalcemia.  Acute renal failure secondary to prerenal azotemia/dehydration. Resolving with IV fluid hydration.  Pyuria with no  clinical signs of an active urinary tract infection. Antibiotics were not continued.  Elevated LFTs. This could be residual from previous rhabdomyolysis. Outpatient abdominal ultrasound sound was nonacute. The bowel hepatitis panel is pending.  History of rhabdomyolysis. Her total CK continues to be elevated, but less than the previous hospitalization.  Type 2 diabetes mellitus. Reasonable control on sliding scale NovoLog only. Amaryl is being held. Her hemoglobin A1c is 7.3.  Hypothyroidism. The patient's TSH is low and her free T4 is within normal limits. We'll consider decreasing her dose of Synthroid.  Hypertension. Her antihypertensive medications are on hold. Her blood pressures trending up.  Likely diabetic neuropathy.   Plan:  1. We'll continue repleting her potassium and magnesium. 2. We'll add calcium supplementation. 3. We'll advance her diet as tolerated to a carbohydrate modified diet. 4. Will continue to check for C. difficile colitis. 5. Will restart her chronic medications with  exception of Amaryl and Flagyl and amoxicillin. 6. Will check a sedimentation rate, ANA, C-reactive protein, and rheumatoid factor due to her persistently elevated total CK. Check the results of the viral hepatitis panel pending. 7. We'll add Neurontin each bedtime for diabetic neuropathy.    LOS: 1 day   Rosy Estabrook 12/23/2011, 8:53 AM

## 2011-12-24 DIAGNOSIS — R197 Diarrhea, unspecified: Secondary | ICD-10-CM

## 2011-12-24 DIAGNOSIS — I10 Essential (primary) hypertension: Secondary | ICD-10-CM

## 2011-12-24 LAB — GLUCOSE, CAPILLARY
Glucose-Capillary: 108 mg/dL — ABNORMAL HIGH (ref 70–99)
Glucose-Capillary: 113 mg/dL — ABNORMAL HIGH (ref 70–99)
Glucose-Capillary: 174 mg/dL — ABNORMAL HIGH (ref 70–99)

## 2011-12-24 LAB — CBC
Hemoglobin: 11.3 g/dL — ABNORMAL LOW (ref 12.0–15.0)
MCV: 86.5 fL (ref 78.0–100.0)
Platelets: 196 10*3/uL (ref 150–400)
RBC: 3.77 MIL/uL — ABNORMAL LOW (ref 3.87–5.11)
WBC: 11 10*3/uL — ABNORMAL HIGH (ref 4.0–10.5)

## 2011-12-24 LAB — URINE CULTURE: Colony Count: NO GROWTH

## 2011-12-24 LAB — BASIC METABOLIC PANEL
CO2: 23 mEq/L (ref 19–32)
Chloride: 99 mEq/L (ref 96–112)
Creatinine, Ser: 0.94 mg/dL (ref 0.50–1.10)

## 2011-12-24 MED ORDER — PANTOPRAZOLE SODIUM 40 MG PO TBEC
40.0000 mg | DELAYED_RELEASE_TABLET | Freq: Every day | ORAL | Status: DC
  Administered 2011-12-25: 40 mg via ORAL
  Filled 2011-12-24: qty 1

## 2011-12-24 NOTE — Progress Notes (Addendum)
Pharmacist Heart Failure Core Measure Documentation  Rationale: Heart failure patients with left ventricular systolic dysfunction (LVSD) and an EF < 40% should be prescribed an angiotensin converting enzyme inhibitor (ACEI) or angiotensin receptor blocker (ARB) at discharge unless a contraindication is documented in the medical record.  This patient is not currently on an ACEI or ARB for HF.  This note is being placed in the record in order to provide documentation that a contraindication to the use of these agents is present for this encounter.  ACE Inhibitor or Angiotensin Receptor Blocker is contraindicated (specify all that apply)  [x]   ACEI allergy AND ARB allergy []   Angioedema []   Moderate or severe aortic stenosis []   Hyperkalemia []   Hypotension []   Renal artery stenosis []   Worsening renal function, preexisting renal disease or dysfunction   Julia Maynard 12/24/2011 11:06 AM    The patient is receiving Prontonix by the intravenous route.  Based on criteria approved by the Pharmacy and Therapeutics Committee and the Medical Executive Committee, the medication is being converted to the equivalent oral dose form.  These criteria include: -No Active GI bleeding -Able to tolerate diet of full liquids (or better) or tube feeding OR able to tolerate other medications by the oral or enteral route  If you have any questions about this conversion, please contact the Pharmacy Department (ext [phone]).  Thank you.  Julia Maynard, [facility] 12/24/2011 10:41 AM

## 2011-12-24 NOTE — Progress Notes (Signed)
Subjective: The patient has no complaints of chest pain, shortness of breath, abdominal pain, nausea, vomiting, or diarrhea. She has not had a bowel movement for she's been here.  Objective: Vital signs in last 24 hours: Filed Vitals:   12/23/11 0606 12/23/11 1436 12/23/11 2136 12/24/11 0512  BP: 157/74 146/70 152/79 119/70  Pulse: 85 65 84 80  Temp: 97.3 F (36.3 C) 98.6 F (37 C) 98.4 F (36.9 C) 98.7 F (37.1 C)  TempSrc:   Oral Oral  Resp: 20 20 20 20   Height:      Weight: 83.3 kg (183 lb 10.3 oz)   82.8 kg (182 lb 8.7 oz)  SpO2: 97% 97% 98% 97%    Intake/Output Summary (Last 24 hours) at 12/24/11 1209 Last data filed at 12/24/11 1017  Gross per 24 hour  Intake 2026.67 ml  Output   3350 ml  Net -1323.33 ml    Weight change: 2.513 kg (5 lb 8.7 oz)  Physical exam: Lungs: Decreased breath sounds in the bases, otherwise clear. Heart: S1, S2, with a soft systolic murmur. Abdomen: Positive bowel sounds, obese, nontender, nondistended. Extremities: No pedal edema.  Lab Results: Basic Metabolic Panel:  Basename 12/24/11 0438 12/23/11 0555 12/22/11 1731  NA 131* 136 --  K 4.1 2.8* --  CL 99 101 --  CO2 23 20 --  GLUCOSE 105* 180* --  BUN 6 11 --  CREATININE 0.94 1.13* --  CALCIUM 7.3* 6.7* --  MG -- 1.3* 0.7*  PHOS -- -- --   Liver Function Tests:  Basename 12/23/11 0555 12/22/11 1306  AST 190* 254*  ALT 132* 169*  ALKPHOS 40 46  BILITOT 0.7 0.9  PROT 6.4 7.7  ALBUMIN 3.0* 3.6    Basename 12/22/11 1306  LIPASE 25  AMYLASE --   No results found for this basename: AMMONIA:2 in the last 72 hours CBC:  Basename 12/24/11 0438 12/23/11 0555 12/22/11 1306  WBC 11.0* 12.4* --  NEUTROABS -- -- 12.6*  HGB 11.3* 12.1 --  HCT 32.6* 34.3* --  MCV 86.5 86.4 --  PLT 196 245 --   Cardiac Enzymes:  Basename 12/23/11 0555 12/22/11 1731  CKTOTAL 813* 683*  CKMB 6.9* 6.8*  CKMBINDEX -- --  TROPONINI <0.30 <0.30   BNP:  Basename 12/22/11 1731  PROBNP  1481.0*   D-Dimer: No results found for this basename: DDIMER:2 in the last 72 hours CBG:  Basename 12/24/11 1138 12/24/11 0740 12/24/11 0217 12/23/11 2128 12/23/11 1620 12/23/11 1205  GLUCAP 175* 86 108* 139* 186* 111*   Hemoglobin A1C:  Basename 12/22/11 1731  HGBA1C 7.3*   Fasting Lipid Panel: No results found for this basename: CHOL,HDL,LDLCALC,TRIG,CHOLHDL,LDLDIRECT in the last 72 hours Thyroid Function Tests:  Basename 12/22/11 1731  TSH 0.153*  T4TOTAL --  FREET4 1.50  T3FREE --  THYROIDAB --   Anemia Panel: No results found for this basename: VITAMINB12,FOLATE,FERRITIN,TIBC,IRON,RETICCTPCT in the last 72 hours Coagulation: No results found for this basename: LABPROT:2,INR:2 in the last 72 hours Urine Drug Screen: Drugs of Abuse  No results found for this basename: labopia,  cocainscrnur,  labbenz,  amphetmu,  thcu,  labbarb    Alcohol Level: No results found for this basename: ETH:2 in the last 72 hours Urinalysis:  Basename 12/22/11 1306  COLORURINE AMBER*  LABSPEC 1.020  PHURINE 6.0  GLUCOSEU NEGATIVE  HGBUR NEGATIVE  BILIRUBINUR SMALL*  KETONESUR NEGATIVE  PROTEINUR 30*  UROBILINOGEN 0.2  NITRITE POSITIVE*  LEUKOCYTESUR NEGATIVE   Misc. Labs:  Micro: No results found for this or any previous visit (from the past 240 hour(s)).  Studies/Results: Dg Chest Port 1 View  12/22/2011  *RADIOLOGY REPORT*  Clinical Data: Weakness  PORTABLE CHEST - 1 VIEW  Comparison: Chest radiograph 01/13/2009  Findings: Stable cardiomegaly.  Atherosclerotic calcification of the thoracic aortic arch.  Pulmonary vascularity is normal.  The lungs are clear.  No visible airspace disease or pleural effusion.  Negative for pneumothorax.  Remote, healed lower left rib fracture deformities.  No acute bony abnormality identified.  Bones are osteopenic.  IMPRESSION: No acute cardiopulmonary disease.  Stable cardiomegaly.  Original Report Authenticated By: Britta Mccreedy, M.D.     Medications:  Scheduled:    . sodium chloride   Intravenous STAT  . amLODipine  10 mg Oral Daily  . calcium-vitamin D  1 tablet Oral TID  . enoxaparin  40 mg Subcutaneous Q24H  . gabapentin  100 mg Oral QHS  . insulin aspart  0-5 Units Subcutaneous QHS  . insulin aspart  0-9 Units Subcutaneous TID WC  . levothyroxine  75 mcg Oral QAC breakfast  . metoprolol tartrate  50 mg Oral BID  . pantoprazole  40 mg Oral Q1200  . potassium chloride  20 mEq Oral TID  . DISCONTD: pantoprazole (PROTONIX) IV  40 mg Intravenous Daily   Continuous:    . 0.9 % NaCl with KCl 40 mEq / L 70 mL/hr at 12/24/11 0405   FAO:[id number], acetaminophen, alum & mag hydroxide-simeth, levalbuterol, morphine injection, ondansetron (ZOFRAN) IV, ondansetron  Assessment: Principal Problem:  *Nausea and vomiting Active Problems:  Diarrhea  HTN (hypertension)  Type II or unspecified type diabetes mellitus without mention of complication, not stated as uncontrolled  Hypothyroid  GERD (gastroesophageal reflux disease)  Debility  Dehydration  Hypokalemia  Elevated LFTs  Pyuria  Acute renal failure  Hypocalcemia  Hypomagnesemia  Diabetic neuropathy   1. Nausea, vomiting, and diarrhea. Possibly secondary to acute gastroenteritis versus side effects from multiple antibiotics. Symptoms have resolved.  Electrolyte abnormalities including hypokalemia, hypomagnesemia, and hypocalcemia. Hyponatremia has resolved. She received a total of 4 g of magnesium sulfate The hypomagnesemia could be causing hypocalcemia. She is on appropriate supplementation orally.  Acute renal failure secondary to prerenal azotemia/dehydration. Resolved with IV fluid hydration.  Pyuria with no clinical signs of an active urinary tract infection. Antibiotics were not continued.  Elevated LFTs. This could be residual from previous rhabdomyolysis. Outpatient abdominal ultrasound sound was nonacute. The viral hepatitis panel is  pending.  History of rhabdomyolysis. Her total CK continues to be elevated, but less than the previous hospitalization. Her CRP is 29.3, elevated. Her sed rate is modestly elevated at 35. Her rheumatoid factor is negative.  Type 2 diabetes mellitus. Reasonable control on sliding scale NovoLog only. Amaryl is being held. Her hemoglobin A1c is 7.3.  Hypothyroidism. The patient's TSH is low and her free T4 is within normal limits. We'll consider decreasing her dose of Synthroid, but in the setting of illness and multiple antibiotics, would defer followup TSH to the outpatient setting and 1-2 months.  Hypertension. Metoprolol and amlodipine have been restarted.  Likely diabetic neuropathy. Neurontin started.   Plan:  1. Continue current treatment. 2. Will check electrolytes including magnesium in the morning. We'll check followup CK and liver transaminases. Will check the results of the viral hepatitis panel still pending. 3. Advance diet to a carbohydrate modified.   LOS: 2 days   Yusuke Beza 12/24/2011, 12:09 PM

## 2011-12-25 ENCOUNTER — Encounter (HOSPITAL_COMMUNITY): Payer: Self-pay | Admitting: Internal Medicine

## 2011-12-25 DIAGNOSIS — E875 Hyperkalemia: Secondary | ICD-10-CM | POA: Diagnosis not present

## 2011-12-25 DIAGNOSIS — E871 Hypo-osmolality and hyponatremia: Secondary | ICD-10-CM

## 2011-12-25 DIAGNOSIS — I10 Essential (primary) hypertension: Secondary | ICD-10-CM

## 2011-12-25 LAB — BASIC METABOLIC PANEL
BUN: 8 mg/dL (ref 6–23)
Calcium: 8.7 mg/dL (ref 8.4–10.5)
Creatinine, Ser: 1.01 mg/dL (ref 0.50–1.10)
GFR calc Af Amer: 60 mL/min — ABNORMAL LOW (ref >90)

## 2011-12-25 LAB — COMPREHENSIVE METABOLIC PANEL
ALT: 84 U/L — ABNORMAL HIGH (ref 0–35)
BUN: 7 mg/dL (ref 6–23)
Calcium: 8.2 mg/dL — ABNORMAL LOW (ref 8.4–10.5)
Creatinine, Ser: 0.93 mg/dL (ref 0.50–1.10)
GFR calc Af Amer: 66 mL/min — ABNORMAL LOW (ref >90)
GFR calc non Af Amer: 57 mL/min — ABNORMAL LOW (ref >90)
Glucose, Bld: 125 mg/dL — ABNORMAL HIGH (ref 70–99)
Sodium: 128 mEq/L — ABNORMAL LOW (ref 135–145)
Total Protein: 6.2 g/dL (ref 6.0–8.3)

## 2011-12-25 LAB — CBC
Hemoglobin: 11.8 g/dL — ABNORMAL LOW (ref 12.0–15.0)
Platelets: 231 10*3/uL (ref 150–400)
RBC: 3.86 MIL/uL — ABNORMAL LOW (ref 3.87–5.11)
WBC: 10.6 10*3/uL — ABNORMAL HIGH (ref 4.0–10.5)

## 2011-12-25 LAB — HEPATITIS PANEL, ACUTE
HCV Ab: NEGATIVE
Hep A IgM: NEGATIVE

## 2011-12-25 LAB — GLUCOSE, CAPILLARY
Glucose-Capillary: 125 mg/dL — ABNORMAL HIGH (ref 70–99)
Glucose-Capillary: 119 mg/dL — ABNORMAL HIGH (ref 70–99)

## 2011-12-25 LAB — CK TOTAL AND CKMB (NOT AT ARMC)
CK, MB: 4.4 ng/mL — ABNORMAL HIGH (ref 0.3–4.0)
Relative Index: 1.2 (ref 0.0–2.5)
Total CK: 376 U/L — ABNORMAL HIGH (ref 7–177)

## 2011-12-25 LAB — MAGNESIUM: Magnesium: 1.1 mg/dL — ABNORMAL LOW (ref 1.5–2.5)

## 2011-12-25 MED ORDER — OMEPRAZOLE 20 MG PO CPDR: 20.0000 mg | DELAYED_RELEASE_CAPSULE | Freq: Every day | ORAL | Status: DC

## 2011-12-25 MED ORDER — MAGNESIUM OXIDE 400 MG PO TABS: 400.0000 mg | ORAL_TABLET | Freq: Two times a day (BID) | ORAL | Status: AC

## 2011-12-25 MED ORDER — SODIUM CHLORIDE 0.45 % IV SOLN
INTRAVENOUS | Status: DC
  Administered 2011-12-25: 09:00:00 via INTRAVENOUS
  Filled 2011-12-25 (×4): qty 1000

## 2011-12-25 MED ORDER — CALCIUM CARBONATE-VITAMIN D 500-200 MG-UNIT PO TABS: 1.0000 | ORAL_TABLET | Freq: Every day | ORAL | Status: DC

## 2011-12-25 MED ORDER — FUROSEMIDE 10 MG/ML IJ SOLN: 30.0000 mg | Freq: Once | INTRAMUSCULAR | Status: DC

## 2011-12-25 MED ORDER — SODIUM POLYSTYRENE SULFONATE 15 GM/60ML PO SUSP
15.0000 g | Freq: Once | ORAL | Status: AC
  Administered 2011-12-25: 15 g via ORAL
  Filled 2011-12-25: qty 60

## 2011-12-25 MED ORDER — CALCIUM CARBONATE-VITAMIN D 500-200 MG-UNIT PO TABS: 1.0000 | ORAL_TABLET | Freq: Two times a day (BID) | ORAL | Status: DC

## 2011-12-25 MED ORDER — ALBUTEROL SULFATE (5 MG/ML) 0.5% IN NEBU
2.5000 mg | INHALATION_SOLUTION | Freq: Once | RESPIRATORY_TRACT | Status: DC
  Filled 2011-12-25: qty 0.5

## 2011-12-25 MED ORDER — GLIMEPIRIDE 4 MG PO TABS: 4.0000 mg | ORAL_TABLET | Freq: Every day | ORAL | Status: AC

## 2011-12-25 MED ORDER — FUROSEMIDE 10 MG/ML IJ SOLN
30.0000 mg | Freq: Once | INTRAMUSCULAR | Status: AC
  Administered 2011-12-25: 30 mg via INTRAVENOUS
  Filled 2011-12-25: qty 4

## 2011-12-25 NOTE — Discharge Summary (Signed)
Physician Discharge Summary  Supriya Beaston MRN: [id number] DOB/AGE: [birth year] 76 y.o.  PCP: Reynolds Bowl, MD, MD   Admit date: 12/22/2011 Discharge date: 12/25/2011  Discharge Diagnoses:  1. Nausea, vomiting, and diarrhea secondary to either or an acute viral gastroenteritis or antibiotic-induced gastrointestinal symptomatology. Resolved. 2. Dehydration. 3. Hypokalemia and subsequent hyperkalemia. The patient's serum potassium was 5.4 at the time of discharge. THE PATIENT'S SERUM POTASSIUM WILL NEED TO BE CHECKED DAILY UNTIL WITHIN NORMAL LIMITS AND WHEN NECESSARY THEREAFTER GIVEN HER HISTORY OF BOTH HYPOKALEMIA AND HYPERKALEMIA. 4. Hypomagnesemia. The patient's magnesium level was low at 1.1 at the time of discharge. THE PATIENT'S POTASSIUM LEVEL WILL NEED TO BE REASSESSED IN 5-7 DAYS. 5. Hypocalcemia. The patient's calcium fell to a nadir of 6.7 but normalized to 8.7 prior to discharge. 6. Acute renal failure secondary to dehydration/prerenal azotemia. Resolved. 7. Elevated liver transaminases with negative outpatient abdominal ultrasound and history of cholecystectomy. The patient's AST was 73 and her ALT was 84 prior to discharge. The bowel hepatitis panel was negative. 8. Previous history of rhabdomyolysis. Her total CK was 376 prior to discharge. Rheumatoid factor was negative. ANA was negative. High sensitivity CRP was elevated at 29. 9. Type 2 diabetes mellitus. Hemoglobin A1c was 7.3. 10. Hypothyroidism. Low TSH of 0.153. Normal free T4 of 1.50. THE PATIENT WILL NEED A FOLLOWUP TSH AND FREE T4 IN 8-12 WEEKS. 11. Gastroesophageal reflux disease. 12. Chronic debility, necessitating discharge back to the skilled nursing facility.    Medication List  As of 12/25/2011  3:49 PM   STOP taking these medications         amoxicillin 500 MG tablet      metroNIDAZOLE 500 MG tablet         TAKE these medications         acetaminophen 325 MG tablet   Commonly known as: TYLENOL   Take 2 tablets (650 mg total) by mouth every 6 (six) hours as needed (or Fever >/= 101).      amLODipine 10 MG tablet   Commonly known as: NORVASC   Take 10 mg by mouth daily.      aspirin 81 MG tablet   Take 81 mg by mouth daily.      calcium-vitamin D 500-200 MG-UNIT per tablet   Commonly known as: OSCAL WITH D   Take 1 tablet by mouth daily.      cholecalciferol 1000 UNITS tablet   Commonly known as: VITAMIN D   Take 1,000 Units by mouth daily.      colchicine 0.6 MG tablet   Take 0.6 mg by mouth 2 (two) times daily.      ferrous fumarate 325 (106 FE) MG Tabs   Commonly known as: HEMOCYTE - 106 mg FE   Take 1 tablet by mouth daily.      glimepiride 4 MG tablet   Commonly known as: AMARYL   Take 1 tablet (4 mg total) by mouth daily with breakfast. HOLD FOR CAPILLARY BLOOD GLUCOSE OF LESS THAN 120.      levothyroxine 75 MCG tablet   Commonly known as: SYNTHROID, LEVOTHROID   Take 75 mcg by mouth daily.      magnesium oxide 400 MG tablet   Commonly known as: MAG-OX   Take 1 tablet (400 mg total) by mouth 2 (two) times daily.      metoprolol 50 MG tablet   Commonly known as: LOPRESSOR   Take 50 mg by mouth 2 (two) times  daily.      omeprazole 20 MG capsule   Commonly known as: PRILOSEC   Take 1 capsule (20 mg total) by mouth daily.      ondansetron 4 MG tablet   Commonly known as: ZOFRAN   Take 4 mg by mouth every 8 (eight) hours as needed. Nausea and Vomiting            Discharge Condition: Improved.  Disposition: 03-Skilled Nursing Facility   Consults: None.   Significant Diagnostic Studies: Dg Chest Port 1 View  12/22/2011  *RADIOLOGY REPORT*  Clinical Data: Weakness  PORTABLE CHEST - 1 VIEW  Comparison: Chest radiograph 01/13/2009  Findings: Stable cardiomegaly.  Atherosclerotic calcification of the thoracic aortic arch.  Pulmonary vascularity is normal.  The lungs are clear.  No visible airspace disease or pleural effusion.  Negative for pneumothorax.   Remote, healed lower left rib fracture deformities.  No acute bony abnormality identified.  Bones are osteopenic.  IMPRESSION: No acute cardiopulmonary disease.  Stable cardiomegaly.  Original Report Authenticated By: Britta Mccreedy, M.D.     Microbiology: Recent Results (from the past 240 hour(s))  URINE CULTURE     Status: Normal   Collection Time   12/22/11  7:55 PM      Component Value Range Status Comment   Specimen Description URINE, CLEAN CATCH   Final    Special Requests Normal   Final    Culture  Setup Time [id number]   Final    Colony Count NO GROWTH   Final    Culture NO GROWTH   Final    Report Status 12/24/2011 FINAL   Final      Labs: Results for orders placed during the hospital encounter of 12/22/11 (from the past 48 hour(s))  GLUCOSE, CAPILLARY     Status: Abnormal   Collection Time   12/23/11  4:20 PM      Component Value Range Comment   Glucose-Capillary 186 (*) 70 - 99 (mg/dL)   GLUCOSE, CAPILLARY     Status: Abnormal   Collection Time   12/23/11  9:28 PM      Component Value Range Comment   Glucose-Capillary 139 (*) 70 - 99 (mg/dL)   GLUCOSE, CAPILLARY     Status: Abnormal   Collection Time   12/24/11  2:17 AM      Component Value Range Comment   Glucose-Capillary 108 (*) 70 - 99 (mg/dL)   CBC     Status: Abnormal   Collection Time   12/24/11  4:38 AM      Component Value Range Comment   WBC 11.0 (*) 4.0 - 10.5 (K/uL)    RBC 3.77 (*) 3.87 - 5.11 (MIL/uL)    Hemoglobin 11.3 (*) 12.0 - 15.0 (g/dL)    HCT 81.1 (*) 91.4 - 46.0 (%)    MCV 86.5  78.0 - 100.0 (fL)    MCH 30.0  26.0 - 34.0 (pg)    MCHC 34.7  30.0 - 36.0 (g/dL)    RDW 78.2 (*) 95.6 - 15.5 (%)    Platelets 196  150 - 400 (K/uL)   BASIC METABOLIC PANEL     Status: Abnormal   Collection Time   12/24/11  4:38 AM      Component Value Range Comment   Sodium 131 (*) 135 - 145 (mEq/L)    Potassium 4.1  3.5 - 5.1 (mEq/L) DELTA CHECK NOTED   Chloride 99  96 - 112 (mEq/L)    CO2 23  19 -  32 (mEq/L)     Glucose, Bld 105 (*) 70 - 99 (mg/dL)    BUN 6  6 - 23 (mg/dL)    Creatinine, Ser 4.09  0.50 - 1.10 (mg/dL)    Calcium 7.3 (*) 8.4 - 10.5 (mg/dL)    GFR calc non Af Amer 57 (*) >90 (mL/min)    GFR calc Af Amer 66 (*) >90 (mL/min)   GLUCOSE, CAPILLARY     Status: Normal   Collection Time   12/24/11  7:40 AM      Component Value Range Comment   Glucose-Capillary 86  70 - 99 (mg/dL)    Comment 1 Notify RN      Comment 2 Documented in Chart     GLUCOSE, CAPILLARY     Status: Abnormal   Collection Time   12/24/11 11:38 AM      Component Value Range Comment   Glucose-Capillary 175 (*) 70 - 99 (mg/dL)    Comment 1 Notify RN      Comment 2 Documented in Chart     GLUCOSE, CAPILLARY     Status: Abnormal   Collection Time   12/24/11  4:28 PM      Component Value Range Comment   Glucose-Capillary 113 (*) 70 - 99 (mg/dL)    Comment 1 Notify RN      Comment 2 Documented in Chart     GLUCOSE, CAPILLARY     Status: Abnormal   Collection Time   12/24/11  9:16 PM      Component Value Range Comment   Glucose-Capillary 174 (*) 70 - 99 (mg/dL)    Comment 1 Notify RN     COMPREHENSIVE METABOLIC PANEL     Status: Abnormal   Collection Time   12/25/11  4:30 AM      Component Value Range Comment   Sodium 128 (*) 135 - 145 (mEq/L)    Potassium 5.8 (*) 3.5 - 5.1 (mEq/L)    Chloride 97  96 - 112 (mEq/L)    CO2 24  19 - 32 (mEq/L)    Glucose, Bld 125 (*) 70 - 99 (mg/dL)    BUN 7  6 - 23 (mg/dL)    Creatinine, Ser 8.11  0.50 - 1.10 (mg/dL)    Calcium 8.2 (*) 8.4 - 10.5 (mg/dL)    Total Protein 6.2  6.0 - 8.3 (g/dL)    Albumin 2.8 (*) 3.5 - 5.2 (g/dL)    AST 73 (*) 0 - 37 (U/L)    ALT 84 (*) 0 - 35 (U/L)    Alkaline Phosphatase 37 (*) 39 - 117 (U/L)    Total Bilirubin 0.7  0.3 - 1.2 (mg/dL)    GFR calc non Af Amer 57 (*) >90 (mL/min)    GFR calc Af Amer 66 (*) >90 (mL/min)   MAGNESIUM     Status: Abnormal   Collection Time   12/25/11  4:30 AM      Component Value Range Comment   Magnesium 1.1 (*) 1.5  - 2.5 (mg/dL)   CBC     Status: Abnormal   Collection Time   12/25/11  4:30 AM      Component Value Range Comment   WBC 10.6 (*) 4.0 - 10.5 (K/uL)    RBC 3.86 (*) 3.87 - 5.11 (MIL/uL)    Hemoglobin 11.8 (*) 12.0 - 15.0 (g/dL)    HCT 91.4 (*) 78.2 - 46.0 (%)    MCV 88.3  78.0 - 100.0 (fL)  MCH 30.6  26.0 - 34.0 (pg)    MCHC 34.6  30.0 - 36.0 (g/dL)    RDW 16.1 (*) 09.6 - 15.5 (%)    Platelets 231  150 - 400 (K/uL)   CK TOTAL AND CKMB     Status: Abnormal   Collection Time   12/25/11  4:30 AM      Component Value Range Comment   Total CK 376 (*) 7 - 177 (U/L)    CK, MB 4.4 (*) 0.3 - 4.0 (ng/mL)    Relative Index 1.2  0.0 - 2.5    GLUCOSE, CAPILLARY     Status: Abnormal   Collection Time   12/25/11  7:40 AM      Component Value Range Comment   Glucose-Capillary 125 (*) 70 - 99 (mg/dL)   GLUCOSE, CAPILLARY     Status: Abnormal   Collection Time   12/25/11 11:46 AM      Component Value Range Comment   Glucose-Capillary 136 (*) 70 - 99 (mg/dL)    Comment 1 Notify RN     BASIC METABOLIC PANEL     Status: Abnormal   Collection Time   12/25/11  1:28 PM      Component Value Range Comment   Sodium 132 (*) 135 - 145 (mEq/L)    Potassium 5.4 (*) 3.5 - 5.1 (mEq/L)    Chloride 97  96 - 112 (mEq/L)    CO2 25  19 - 32 (mEq/L)    Glucose, Bld 138 (*) 70 - 99 (mg/dL)    BUN 8  6 - 23 (mg/dL)    Creatinine, Ser 0.45  0.50 - 1.10 (mg/dL)    Calcium 8.7  8.4 - 10.5 (mg/dL)    GFR calc non Af Amer 52 (*) >90 (mL/min)    GFR calc Af Amer 60 (*) >90 (mL/min)      HPI : The patient is a 78 year old woman with a history significant for type 2 diabetes mellitus, chronic kidney disease, hypertension, and reported congestive heart failure (although, her ejection fraction was 70% per the 2-D echocardiogram in July 2010). She presented from the [facility] with a chief complaint of nausea, vomiting, and episodic diarrhea. She had been treated with a total of 3 antibiotics over a three-week period for  2 apparent urinary tract infections. She had an outpatient pelvic ultrasound which was essentially negative. She had an outpatient abdominal ultrasound which revealed a surgically absent gallbladder, no liver abnormalities and no biliary ductal dilatation. In the emergency department, she was afebrile and hemodynamically stable. Her lab data were significant for a WBC of 15.1, sodium of 130, potassium of 2.6, creatinine of 1.48, calcium of 7.0, glucose of 155, AST of 254, and ALT of 169. Her urinalysis revealed 3-6 WBCs and a few bacteria. She was admitted for further evaluation and management.  HOSPITAL COURSE: The patient was started on IV fluid hydration with normal saline with potassium chloride added. Amaryl was discontinued given her marginal oral intake. Her diabetes was treated with sliding scale NovoLog. He'll suspected that the patient had antibiotic induced gastroenteritis versus an acute bowel gastroenteritis. Although her urinalysis revealed a few WBCs, she did not have clinical urinary tract infection. Therefore, all antibiotics and Flagyl which had been prescribed before the hospitalization were discontinued. Intravenous Protonix was started empirically. Zofran was ordered as needed for nausea. When she was able to take oral potassium, he was given by mouth. Metoprolol and amlodipine wasn't restarted once her blood pressure started  increasing following IV fluid hydration.  For further evaluation, a number of studies were ordered. C. difficile PCR was never resulted as the patient had no bowel movements during the hospitalization. In essence, her diarrhea resolved. Her magnesium level was low at 0.7. She was given 2 g of magnesium sulfate intravenously. In improved to 1.3 but then it decreased to 1.1 prior to discharge. She was given an additional 2 g of magnesium sulfate and subsequently sent home on oral magnesium oxide. Her calcium level fell to 6.7. She was started on calcium supplementation.  Prior to discharge, her calcium level improved to 8.7. Following potassium chloride supplementation, her serum potassium improved to 4.1. However, on the day of anticipated discharge, it was 5.8. She was treated with IV Lasix, bicarbonate infusion, albuterol nebulizer, and Kayexalate. Prior discharge, her serum potassium improved to 5.4. She will need to continue to have her potassium  reassessed in the outpatient setting. Her liver transaminases improved with IV fluid hydration. Her viral hepatitis panel was negative. Her troponin I. was negative but her total CK was 813. With hydration, it improved to 376. The patient's hospitalization in April of 2013 was for treatment of rhabdomyolysis. The persistent elevation in her CK may have been the residual from the previous rhabdomyolysis and/or dehydration. Nevertheless, additional studies were ordered.  Her sedimentation rate was marginally elevated at 35. CRP was elevated at 29. ANA was negative. Rheumatoid factor was negative.  The patient improved clinically and symptomatically. Her white blood cell count nearly normalized without antibiotics. Her diabetes was well controlled. Her diet was advanced which she tolerated well. She had absolutely no complaints of nausea, vomiting, or diarrhea during the hospitalization. Because of her chronic debility, she was discharged back to the skilled nursing facility.  She will need to have her serum potassium, magnesium, and calcium levels rechecked at the skilled nursing facility.   Discharge Exam:  Blood pressure 135/78, pulse 81, temperature 98.3 F (36.8 C), temperature source Oral, resp. rate 16, height 5\' 2"  (1.575 m), weight 81.8 kg (180 lb 5.4 oz), SpO2 96.00%. Lungs: Decreased breath sounds in the bases, otherwise clear. Heart: S1, S2, with no murmurs rubs or gallops. Abdomen: Obese, positive bowel sounds, soft, nontender, nondistended. Extremities: No pedal edema. Neurologic: She is alert and oriented x3.  Cranial nerves II through XII are grossly intact.  Discharge Orders    Future Orders Please Complete By Expires   Diet Carb Modified      Increase activity slowly      Discharge instructions      Comments:   At the [facility] the patient will need to have her serum potassium checked daily until it normalizes and as needed thereafter given her history of both hypokalemia and hyperkalemia. She will need to have her blood magnesium rechecked in 5-7 days for assessment of hypomagnesemia. She will need to also have her serum calcium rechecked for assessment of hypocalcemia. Hold Amaryl for blood sugar of less than 120.     Total discharge time: 40 minutes.   Signed: Sharmane Dame 12/25/2011, 3:49 PM

## 2011-12-25 NOTE — Progress Notes (Signed)
Report called to Delena Bali, LPN with [facility] [place], [place]. Discharge instructions will be included in packet and given to patient's son-in-law to give to [facility]. Patient in stable condition and awaiting her ride. Will continue to monitor.

## 2011-12-25 NOTE — Clinical Social Work Note (Signed)
Pt d/c today by MD back to [facility].  Pt, pt's daughter, and facility aware and agreeable.  Per daughter, she will arrange for someone to pick pt up this evening.  D/C will be faxed upon completion to [facility].  No other needs reported.  Derenda Fennel, [place] [phone]

## 2011-12-25 NOTE — Progress Notes (Signed)
Pt declines PT eval.  Wants to know why she isn't leaving the hospital today.  I have asked her RN to speak with her.

## 2011-12-25 NOTE — Progress Notes (Signed)
Patient packet given to patient's son-in-law and instructed to give to [facility], voiced understanding. Patient in stable condition and transported out by tech.

## 2011-12-25 NOTE — Clinical Social Work Psychosocial (Signed)
Clinical Social Work Department BRIEF PSYCHOSOCIAL ASSESSMENT 12/25/2011  Patient:  Julia Maynard, Julia Maynard     Account Number:  [id number]     Admit date:  12/22/2011  Clinical Social Worker:  Nancie Neas  Date/Time:  12/25/2011 12:30 PM  Referred by:  Care Management  Date Referred:  12/25/2011 Referred for  SNF Placement   Other Referral:   Interview type:  Patient Other interview type:    PSYCHOSOCIAL DATA Living Status:  FACILITY Admitted from facility:  [facility] [place] Level of care:  Skilled Nursing Facility Primary support name:  Peggy Primary support relationship to patient:  CHILD, ADULT Degree of support available:   very supportive per pt    CURRENT CONCERNS Current Concerns  Post-Acute Placement   Other Concerns:    SOCIAL WORK ASSESSMENT / PLAN CSW met with pt at bedside.  Pt alert and oriented and reports she has been a resident at [facility] for about 6 weeks.  Pt states her daughter whom she lived with prior to rehab is very supportive and visits daily. Pt reports she is progressing with PT and is hoping to return home soon.  Per Lawanna Kobus at facility, pt is okay to return at d/c.   Assessment/plan status:  Psychosocial Support/Ongoing Assessment of Needs Other assessment/ plan:   Information/referral to community resources:   [facility]    PATIENT'S/FAMILY'S RESPONSE TO PLAN OF CARE: Pt reports positive feelings regarding return to [facility] when medically stable.  Facility is also agreeable to d/c. plan.  CSW will continue to follow and assist with d/c needs.        Derenda Fennel, [place] [phone]

## 2011-12-25 NOTE — Progress Notes (Signed)
Utilization Review Complete  

## 2012-05-24 ENCOUNTER — Other Ambulatory Visit: Payer: Self-pay | Admitting: Family Medicine

## 2012-05-24 LAB — COMPREHENSIVE METABOLIC PANEL
Albumin: 2.8 g/dL — ABNORMAL LOW (ref 3.4–5.0)
Alkaline Phosphatase: 57 U/L (ref 50–136)
Anion Gap: 9 (ref 7–16)
BUN: 22 mg/dL — ABNORMAL HIGH (ref 7–18)
Calcium, Total: 8.8 mg/dL (ref 8.5–10.1)
Co2: 26 mmol/L (ref 21–32)
EGFR (African American): 44 — ABNORMAL LOW
EGFR (Non-African Amer.): 38 — ABNORMAL LOW
Osmolality: 279 (ref 275–301)
Potassium: 4.3 mmol/L (ref 3.5–5.1)
SGOT(AST): 42 U/L — ABNORMAL HIGH (ref 15–37)
Sodium: 136 mmol/L (ref 136–145)
Total Protein: 6.8 g/dL (ref 6.4–8.2)

## 2012-05-24 LAB — LIPID PANEL
Ldl Cholesterol, Calc: 72 mg/dL (ref 0–100)
Triglycerides: 126 mg/dL (ref 0–200)
VLDL Cholesterol, Calc: 25 mg/dL (ref 5–40)

## 2012-05-24 LAB — CBC WITH DIFFERENTIAL/PLATELET
HCT: 37.3 % (ref 35.0–47.0)
Lymphocytes: 44 %
MCH: 32.3 pg (ref 26.0–34.0)
MCHC: 35 g/dL (ref 32.0–36.0)
Platelet: 45 10*3/uL — ABNORMAL LOW (ref 150–440)
RDW: 18.1 % — ABNORMAL HIGH (ref 11.5–14.5)
Segmented Neutrophils: 43 %
WBC: 7.1 10*3/uL (ref 3.6–11.0)

## 2012-05-24 LAB — HEMOGLOBIN A1C: Hemoglobin A1C: 6.7 % — ABNORMAL HIGH (ref 4.2–6.3)

## 2013-09-23 ENCOUNTER — Other Ambulatory Visit (HOSPITAL_COMMUNITY): Payer: Self-pay | Admitting: General Practice

## 2013-09-23 DIAGNOSIS — R208 Other disturbances of skin sensation: Secondary | ICD-10-CM

## 2013-09-23 DIAGNOSIS — N644 Mastodynia: Secondary | ICD-10-CM

## 2013-09-23 DIAGNOSIS — R229 Localized swelling, mass and lump, unspecified: Secondary | ICD-10-CM

## 2013-09-23 DIAGNOSIS — IMO0002 Reserved for concepts with insufficient information to code with codable children: Secondary | ICD-10-CM

## 2013-09-23 DIAGNOSIS — R52 Pain, unspecified: Secondary | ICD-10-CM

## 2013-09-23 DIAGNOSIS — N6009 Solitary cyst of unspecified breast: Secondary | ICD-10-CM

## 2013-10-01 ENCOUNTER — Other Ambulatory Visit (HOSPITAL_COMMUNITY): Payer: Self-pay | Admitting: General Practice

## 2013-10-01 ENCOUNTER — Ambulatory Visit (HOSPITAL_COMMUNITY)
Admission: RE | Admit: 2013-10-01 | Discharge: 2013-10-01 | Disposition: A | Discharge status: Home / Self Care | Payer: Medicare Other | Source: Ambulatory Visit | Attending: General Practice | Admitting: General Practice

## 2013-10-01 ENCOUNTER — Encounter (HOSPITAL_COMMUNITY): Payer: Medicare Other

## 2013-10-01 DIAGNOSIS — N644 Mastodynia: Secondary | ICD-10-CM | POA: Insufficient documentation

## 2013-10-08 ENCOUNTER — Encounter (HOSPITAL_COMMUNITY): Payer: Medicare Other

## 2014-03-07 ENCOUNTER — Emergency Department (HOSPITAL_COMMUNITY)
Admission: EM | Admit: 2014-03-07 | Discharge: 2014-03-07 | Disposition: A | Discharge status: Home / Self Care | Payer: Medicare Other | Attending: Emergency Medicine | Admitting: Emergency Medicine

## 2014-03-07 ENCOUNTER — Encounter (HOSPITAL_COMMUNITY): Payer: Self-pay | Admitting: Emergency Medicine

## 2014-03-07 DIAGNOSIS — Z79899 Other long term (current) drug therapy: Secondary | ICD-10-CM | POA: Diagnosis not present

## 2014-03-07 DIAGNOSIS — I509 Heart failure, unspecified: Secondary | ICD-10-CM | POA: Insufficient documentation

## 2014-03-07 DIAGNOSIS — K219 Gastro-esophageal reflux disease without esophagitis: Secondary | ICD-10-CM | POA: Diagnosis not present

## 2014-03-07 DIAGNOSIS — E1149 Type 2 diabetes mellitus with other diabetic neurological complication: Secondary | ICD-10-CM | POA: Insufficient documentation

## 2014-03-07 DIAGNOSIS — I129 Hypertensive chronic kidney disease with stage 1 through stage 4 chronic kidney disease, or unspecified chronic kidney disease: Secondary | ICD-10-CM | POA: Diagnosis not present

## 2014-03-07 DIAGNOSIS — E039 Hypothyroidism, unspecified: Secondary | ICD-10-CM | POA: Diagnosis not present

## 2014-03-07 DIAGNOSIS — R21 Rash and other nonspecific skin eruption: Secondary | ICD-10-CM | POA: Diagnosis present

## 2014-03-07 DIAGNOSIS — E1142 Type 2 diabetes mellitus with diabetic polyneuropathy: Secondary | ICD-10-CM | POA: Insufficient documentation

## 2014-03-07 DIAGNOSIS — Z7982 Long term (current) use of aspirin: Secondary | ICD-10-CM | POA: Insufficient documentation

## 2014-03-07 DIAGNOSIS — H269 Unspecified cataract: Secondary | ICD-10-CM | POA: Diagnosis not present

## 2014-03-07 DIAGNOSIS — B369 Superficial mycosis, unspecified: Secondary | ICD-10-CM | POA: Diagnosis not present

## 2014-03-07 DIAGNOSIS — N189 Chronic kidney disease, unspecified: Secondary | ICD-10-CM | POA: Diagnosis not present

## 2014-03-07 LAB — CBC WITH DIFFERENTIAL/PLATELET
BASOS ABS: 0 10*3/uL (ref 0.0–0.1)
BASOS PCT: 0 % (ref 0–1)
EOS ABS: 0.4 10*3/uL (ref 0.0–0.7)
EOS PCT: 5 % (ref 0–5)
HCT: 39.1 % (ref 36.0–46.0)
Hemoglobin: 13.5 g/dL (ref 12.0–15.0)
Lymphocytes Relative: 44 % (ref 12–46)
Lymphs Abs: 3.2 10*3/uL (ref 0.7–4.0)
MCH: 30.8 pg (ref 26.0–34.0)
MCHC: 34.5 g/dL (ref 30.0–36.0)
MCV: 89.1 fL (ref 78.0–100.0)
MONO ABS: 0.9 10*3/uL (ref 0.1–1.0)
MONOS PCT: 12 % (ref 3–12)
NEUTROS ABS: 2.8 10*3/uL (ref 1.7–7.7)
Neutrophils Relative %: 39 % — ABNORMAL LOW (ref 43–77)
Platelets: 212 10*3/uL (ref 150–400)
RBC: 4.39 MIL/uL (ref 3.87–5.11)
RDW: 14.4 % (ref 11.5–15.5)
WBC: 7.2 10*3/uL (ref 4.0–10.5)

## 2014-03-07 MED ORDER — FLUCONAZOLE 100 MG PO TABS
100.0000 mg | ORAL_TABLET | Freq: Once | ORAL | Status: AC
  Administered 2014-03-07: 100 mg via ORAL
  Filled 2014-03-07: qty 1

## 2014-03-07 MED ORDER — FLUCONAZOLE 150 MG PO TABS: ORAL_TABLET | ORAL | Status: AC

## 2014-03-07 MED ORDER — LORATADINE 10 MG PO TABS
10.0000 mg | ORAL_TABLET | Freq: Once | ORAL | Status: AC
  Administered 2014-03-07: 10 mg via ORAL
  Filled 2014-03-07: qty 1

## 2014-03-07 MED ORDER — NYSTATIN 100000 UNIT/GM EX POWD: CUTANEOUS | Status: AC

## 2014-03-07 NOTE — ED Provider Notes (Signed)
CSN: [id number]     Arrival date & time 03/07/14  1710 History   First MD Initiated Contact with Patient 03/07/14 1806     Chief Complaint  Patient presents with  . Rash     (Consider location/radiation/quality/duration/timing/severity/associated sxs/prior Treatment) HPI Comments: Patient is an 80 year old female who presents to the emergency department with complaint of rash. The patient and the patient's family gives history that she's been treated for a urinary tract infection a ten-day course of antibiotics. The patient was also been treated with a medicine for a week bladder called Toviaz. Patient complains of a rash in the skin folds, the. The inner thighs, groin area, under the breast, and most recently on the left hand. The patient complains of itching, but no pain. His been no high fevers reported. No nausea vomiting reported.  Patient is a 80 y.o. female presenting with rash. The history is provided by the patient and a relative.  Rash Associated symptoms: no abdominal pain, no joint pain, no shortness of breath and not wheezing     Past Medical History  Diagnosis Date  . Hypertension   . CKD (chronic kidney disease)   . Diabetes mellitus   . Urinary tract infection   . CHF (congestive heart failure)     EF 70% in 2010.  Marland Kitchen Hypothyroidism   . GERD (gastroesophageal reflux disease)   . Cataract   . Gout   . Hyperlipidemia   . Rhabdomyolysis   . Diabetic neuropathy 12/23/2011  . Elevated LFTs 12/2011    Bowel hepatitis panel negative  . Hypomagnesemia 12/2011   Past Surgical History  Procedure Laterality Date  . Cholecystectomy     No family history on file. History  Substance Use Topics  . Smoking status: Never Smoker   . Smokeless tobacco: Current User    Types: Snuff  . Alcohol Use: No   OB History   Grav Para Term Preterm Abortions TAB SAB Ect Mult Living                 Review of Systems  Constitutional: Negative for activity change.       All ROS Neg  except as noted in HPI  HENT: Negative for nosebleeds.   Eyes: Negative for photophobia and discharge.  Respiratory: Negative for cough, shortness of breath and wheezing.   Cardiovascular: Negative for chest pain and palpitations.  Gastrointestinal: Negative for abdominal pain and blood in stool.  Genitourinary: Negative for dysuria, frequency and hematuria.  Musculoskeletal: Negative for arthralgias, back pain and neck pain.  Skin: Positive for rash.  Neurological: Negative for dizziness, seizures and speech difficulty.  Psychiatric/Behavioral: Negative for hallucinations and confusion.      Allergies  Ace inhibitors; Benadryl; Cephalexin; Lasamide; Nitrofuran derivatives; Nystatin-triamcinolone; Prednisone; Sulfa antibiotics; and Angiotensin receptor blockers  Home Medications   Prior to Admission medications   Medication Sig Start Date End Date Taking? Authorizing Provider  amLODipine (NORVASC) 10 MG tablet Take 10 mg by mouth daily.    Historical Provider, MD  aspirin 81 MG tablet Take 81 mg by mouth daily.    Historical Provider, MD  calcium-vitamin D (OSCAL WITH D) 500-200 MG-UNIT per tablet Take 1 tablet by mouth daily. 12/25/11 12/24/12  Elliot Cousin, MD  cholecalciferol (VITAMIN D) 1000 UNITS tablet Take 1,000 Units by mouth daily.    Historical Provider, MD  colchicine 0.6 MG tablet Take 0.6 mg by mouth 2 (two) times daily.     Historical Provider, MD  ferrous  fumarate (HEMOCYTE - 106 MG FE) 325 (106 FE) MG TABS Take 1 tablet by mouth daily.    Historical Provider, MD  glimepiride (AMARYL) 4 MG tablet Take 1 tablet (4 mg total) by mouth daily with breakfast. HOLD FOR CAPILLARY BLOOD GLUCOSE OF LESS THAN 120. 12/25/11 12/24/12  Elliot Cousin, MD  levothyroxine (SYNTHROID, LEVOTHROID) 75 MCG tablet Take 75 mcg by mouth daily.    Historical Provider, MD  metoprolol (LOPRESSOR) 50 MG tablet Take 50 mg by mouth 2 (two) times daily.    Historical Provider, MD  omeprazole (PRILOSEC) 20  MG capsule Take 1 capsule (20 mg total) by mouth daily. 12/25/11 12/24/12  Elliot Cousin, MD  ondansetron (ZOFRAN) 4 MG tablet Take 4 mg by mouth every 8 (eight) hours as needed. Nausea and Vomiting    Historical Provider, MD   BP 181/79  Pulse 68  Temp(Src) 99.2 F (37.3 C) (Oral)  Resp 20  Ht  (1.575 m)  Wt 161 lb (73.029 kg)  BMI 29.44 kg/m2  SpO2 100% Physical Exam  Nursing note and vitals reviewed. Constitutional: She is oriented to person, place, and time. She appears well-developed and well-nourished.  Non-toxic appearance.  HENT:  Head: Normocephalic.  Right Ear: Tympanic membrane and external ear normal.  Left Ear: Tympanic membrane and external ear normal.  Eyes: EOM and lids are normal. Pupils are equal, round, and reactive to light.  Neck: Normal range of motion. Neck supple. Carotid bruit is not present.  Cardiovascular: Normal rate, regular rhythm, normal heart sounds, intact distal pulses and normal pulses.   Pulmonary/Chest: Breath sounds normal. No respiratory distress.  Abdominal: Soft. Bowel sounds are normal. There is no tenderness. There is no guarding.  Musculoskeletal: Normal range of motion.  Lymphadenopathy:       Head (right side): No submandibular adenopathy present.       Head (left side): No submandibular adenopathy present.    She has no cervical adenopathy.  Neurological: She is alert and oriented to person, place, and time. She has normal strength. No cranial nerve deficit or sensory deficit.  Skin: Skin is warm and dry.  Patient is a red macerated rash with scaling edges noted in on the inner aspect of the thighs, the groin area and under the breast. There is a red flat macerated area on the dorsum of the left hand. No red streaks noted.  Psychiatric: She has a normal mood and affect. Her speech is normal.    ED Course  Procedures (including critical care time) Labs Review Labs Reviewed - No data to display  Imaging Review No results  found.   EKG Interpretation None      MDM   Platelets 212,000, wnl. Temperature 99.2, pulse rate 68, respiratory rate 20. Blood pressure is elevated at 181/79.  No purpura present. No evidence of platelet problems. Suspect that the patient has a fungal rash. Doubt allergic reaction to medications. The plan at this time is for the patient to be placed on Claritin for itching, and Diflucan for the yeast infection. Patient also given a prescription for nystatin powder. The patient is to see her primary specialist if not improving.    Final diagnoses:  None    **I have reviewed nursing notes, vital signs, and all appropriate lab and imaging results for this patient.Kathie Dike, PA-C 03/08/14 1558

## 2014-03-07 NOTE — Discharge Instructions (Signed)
Please apply the nystatin cream to the rash areas below the neck to 3 times daily. Please use the Diflucan tablet. May repeat this tablet and one week if needed.

## 2014-03-07 NOTE — ED Notes (Signed)
Pt states that she was placed on a new medication Toviaz 4mg  8 days ago, after taking 3 days worth of medication, pt began to experience rash to inside of thighs, groin area, under breast, left hand. Family reported that they had talked to urologist who advised for pt to apply hydrocortisone cream with no improvement, pt states that the areas itch and burn,

## 2014-03-08 NOTE — ED Provider Notes (Signed)
Medical screening examination/treatment/procedure(s) were conducted as a shared visit with non-physician practitioner(s) and myself.  I personally evaluated the patient during the encounter.  Beefy red rash with satellite lesions to groin, under breasts, inner thighs.  Recently on bactrim for UTI. Also started [place] but has not had any in 4 days.  NO fever or pain.  No CP or SOB. Treat for candida, no evidence of superinfection.  Possible reaction to medications. No oral or genital involvement.     EKG Interpretation None       Glynn Octave, MD 03/08/14 2005

## 2016-08-03 ENCOUNTER — Ambulatory Visit (HOSPITAL_COMMUNITY): Payer: BC Managed Care – PPO | Admitting: Hematology & Oncology

## 2016-08-15 ENCOUNTER — Telehealth (HOSPITAL_COMMUNITY): Payer: Self-pay | Admitting: *Deleted

## 2016-08-22 ENCOUNTER — Ambulatory Visit (HOSPITAL_COMMUNITY): Payer: BC Managed Care – PPO | Admitting: Hematology & Oncology

## 2016-09-20 ENCOUNTER — Other Ambulatory Visit (HOSPITAL_COMMUNITY): Payer: Self-pay | Admitting: Oncology

## 2016-09-20 ENCOUNTER — Encounter (HOSPITAL_COMMUNITY): Payer: Self-pay | Admitting: Oncology

## 2016-09-20 ENCOUNTER — Encounter (HOSPITAL_COMMUNITY): Payer: Medicare Other

## 2016-09-20 ENCOUNTER — Ambulatory Visit (HOSPITAL_COMMUNITY)
Admission: RE | Admit: 2016-09-20 | Discharge: 2016-09-20 | Disposition: A | Discharge status: Home / Self Care | Payer: Medicare Other | Source: Ambulatory Visit | Attending: Oncology | Admitting: Oncology

## 2016-09-20 ENCOUNTER — Encounter (HOSPITAL_COMMUNITY): Payer: Medicare Other | Attending: Oncology | Admitting: Oncology

## 2016-09-20 DIAGNOSIS — D649 Anemia, unspecified: Secondary | ICD-10-CM

## 2016-09-20 DIAGNOSIS — R109 Unspecified abdominal pain: Secondary | ICD-10-CM | POA: Diagnosis not present

## 2016-09-20 DIAGNOSIS — Z79899 Other long term (current) drug therapy: Secondary | ICD-10-CM | POA: Diagnosis not present

## 2016-09-20 DIAGNOSIS — M109 Gout, unspecified: Secondary | ICD-10-CM | POA: Insufficient documentation

## 2016-09-20 DIAGNOSIS — Z888 Allergy status to other drugs, medicaments and biological substances status: Secondary | ICD-10-CM | POA: Insufficient documentation

## 2016-09-20 DIAGNOSIS — R5383 Other fatigue: Secondary | ICD-10-CM | POA: Diagnosis not present

## 2016-09-20 DIAGNOSIS — I509 Heart failure, unspecified: Secondary | ICD-10-CM | POA: Diagnosis not present

## 2016-09-20 DIAGNOSIS — D696 Thrombocytopenia, unspecified: Secondary | ICD-10-CM | POA: Insufficient documentation

## 2016-09-20 DIAGNOSIS — K5900 Constipation, unspecified: Secondary | ICD-10-CM | POA: Diagnosis not present

## 2016-09-20 DIAGNOSIS — K219 Gastro-esophageal reflux disease without esophagitis: Secondary | ICD-10-CM | POA: Diagnosis not present

## 2016-09-20 DIAGNOSIS — E785 Hyperlipidemia, unspecified: Secondary | ICD-10-CM | POA: Insufficient documentation

## 2016-09-20 DIAGNOSIS — E039 Hypothyroidism, unspecified: Secondary | ICD-10-CM | POA: Diagnosis not present

## 2016-09-20 DIAGNOSIS — I130 Hypertensive heart and chronic kidney disease with heart failure and stage 1 through stage 4 chronic kidney disease, or unspecified chronic kidney disease: Secondary | ICD-10-CM | POA: Insufficient documentation

## 2016-09-20 DIAGNOSIS — E1122 Type 2 diabetes mellitus with diabetic chronic kidney disease: Secondary | ICD-10-CM | POA: Diagnosis not present

## 2016-09-20 DIAGNOSIS — Z7984 Long term (current) use of oral hypoglycemic drugs: Secondary | ICD-10-CM | POA: Diagnosis not present

## 2016-09-20 DIAGNOSIS — N189 Chronic kidney disease, unspecified: Secondary | ICD-10-CM | POA: Diagnosis not present

## 2016-09-20 LAB — CBC WITH DIFFERENTIAL/PLATELET
Basophils Absolute: 0 10*3/uL (ref 0.0–0.1)
Basophils Relative: 1 %
EOS ABS: 0.4 10*3/uL (ref 0.0–0.7)
Eosinophils Relative: 6 %
HCT: 37.8 % (ref 36.0–46.0)
Hemoglobin: 12.9 g/dL (ref 12.0–15.0)
LYMPHS ABS: 2.7 10*3/uL (ref 0.7–4.0)
Lymphocytes Relative: 36 %
MCH: 30.4 pg (ref 26.0–34.0)
MCHC: 34.1 g/dL (ref 30.0–36.0)
MCV: 88.9 fL (ref 78.0–100.0)
Monocytes Absolute: 0.8 10*3/uL (ref 0.1–1.0)
Monocytes Relative: 11 %
Neutro Abs: 3.5 10*3/uL (ref 1.7–7.7)
Neutrophils Relative %: 46 %
PLATELETS: 162 10*3/uL (ref 150–400)
RBC: 4.25 MIL/uL (ref 3.87–5.11)
RDW: 15.1 % (ref 11.5–15.5)
WBC: 7.4 10*3/uL (ref 4.0–10.5)

## 2016-09-20 LAB — COMPREHENSIVE METABOLIC PANEL
ALK PHOS: 37 U/L — AB (ref 38–126)
ALT: 18 U/L (ref 14–54)
AST: 34 U/L (ref 15–41)
Albumin: 3.1 g/dL — ABNORMAL LOW (ref 3.5–5.0)
Anion gap: 6 (ref 5–15)
BUN: 18 mg/dL (ref 6–20)
CALCIUM: 9 mg/dL (ref 8.9–10.3)
CHLORIDE: 99 mmol/L — AB (ref 101–111)
CO2: 27 mmol/L (ref 22–32)
CREATININE: 1.3 mg/dL — AB (ref 0.44–1.00)
GFR calc Af Amer: 43 mL/min — ABNORMAL LOW (ref >60)
GFR calc non Af Amer: 37 mL/min — ABNORMAL LOW (ref >60)
GLUCOSE: 179 mg/dL — AB (ref 65–99)
Potassium: 3.9 mmol/L (ref 3.5–5.1)
SODIUM: 132 mmol/L — AB (ref 135–145)
Total Bilirubin: 0.5 mg/dL (ref 0.3–1.2)
Total Protein: 7.1 g/dL (ref 6.5–8.1)

## 2016-09-20 LAB — VITAMIN B12: Vitamin B-12: 762 pg/mL (ref 180–914)

## 2016-09-20 LAB — LACTATE DEHYDROGENASE: LDH: 175 U/L (ref 98–192)

## 2016-09-20 NOTE — Progress Notes (Signed)
[place]  CONSULT NOTE  Patient Care Team: Marval Regal, MD as PCP - General (Family Medicine)  CHIEF COMPLAINTS/PURPOSE OF CONSULTATION:  Thrombocytopenia   No history exists.    HISTORY OF PRESENTING ILLNESS:  Julia Maynard 83 y.o. female is here for thrombocytopenia. Platelet count on 06/26/2016 was 75k. Her PCP is unsure if this could be drug-related.   She is doing well today. She lives with her daughter. Her daughter says that she has been bruising easily. No history of an enlarged spleen or liver problems. She has some fatigue and abdominal pain, but not all of the time. Her daughter says she is constipated very often, but believes this is from her oral iron. Pt has cataracts, as well as some leg swelling. Her daughter says she has not been eating much. Denies chest pain, SOB, or any other concerns.    MEDICAL HISTORY:  Past Medical History:  Diagnosis Date  . Cataract   . CHF (congestive heart failure)    EF 70% in 2010.  Marland Kitchen CKD (chronic kidney disease)   . Diabetes mellitus   . Diabetic neuropathy 12/23/2011  . Elevated LFTs 12/2011   Bowel hepatitis panel negative  . GERD (gastroesophageal reflux disease)   . Gout   . Hyperlipidemia   . Hypertension   . Hypomagnesemia 12/2011  . Hypothyroidism   . Rhabdomyolysis   . Urinary tract infection     SURGICAL HISTORY: Past Surgical History:  Procedure Laterality Date  . CHOLECYSTECTOMY      SOCIAL HISTORY: Social History   Social History  . Marital status: Widowed    Spouse name: N/A  . Number of children: N/A  . Years of education: N/A   Occupational History  . Not on file.   Social History Main Topics  . Smoking status: Never Smoker  . Smokeless tobacco: Current User    Types: Snuff  . Alcohol use No  . Drug use: No  . Sexual activity: Not on file   Other Topics Concern  . Not on file   Social History Narrative  . No narrative on file    FAMILY HISTORY: No family history on  file.  ALLERGIES:  is allergic to ace inhibitors; benadryl [diphenhydramine]; cephalexin; lasix [furosemide]; nitrofuran derivatives; nystatin-triamcinolone; prednisone; sulfa antibiotics; and angiotensin receptor blockers.  MEDICATIONS:  Current Outpatient Prescriptions  Medication Sig Dispense Refill  . amLODipine (NORVASC) 10 MG tablet Take 10 mg by mouth every morning.     . calcium-vitamin D (OSCAL WITH D) 500-200 MG-UNIT per tablet Take 1 tablet by mouth every morning.    . cholecalciferol (VITAMIN D) 1000 UNITS tablet Take 1,000 Units by mouth every morning.     . colchicine 0.6 MG tablet Take 0.6 mg by mouth every morning.     . fluconazole (DIFLUCAN) 150 MG tablet Take one tablet today, may repeat in 1 week if needed. 2 tablet 0  . glimepiride (AMARYL) 4 MG tablet Take 1 tablet (4 mg total) by mouth daily with breakfast. HOLD FOR CAPILLARY BLOOD GLUCOSE OF LESS THAN 120.    . iron polysaccharides (POLY-IRON 150) 150 MG capsule Take 150 mg by mouth every morning.     Marland Kitchen LACTOBACILLUS PO Take 1 tablet by mouth every morning.     Marland Kitchen levothyroxine (SYNTHROID, LEVOTHROID) 75 MCG tablet Take 75 mcg by mouth daily.    . magnesium 30 MG tablet Take 30 mg by mouth every morning.    . metoprolol (LOPRESSOR) 50  MG tablet Take 50 mg by mouth 2 (two) times daily.    Marland Kitchen nystatin (MYCOSTATIN/NYSTOP) 100000 UNIT/GM POWD Apply to rash 2 or 3 times daily. 30 g 0  . omeprazole (PRILOSEC) 20 MG capsule Take 20 mg by mouth every morning.     No current facility-administered medications for this visit.     Review of Systems  Constitutional: Positive for malaise/fatigue.       Loss of appetite.   HENT: Negative.   Eyes: Positive for blurred vision (cataracts).  Respiratory: Negative.  Negative for shortness of breath.   Cardiovascular: Positive for leg swelling. Negative for chest pain.  Gastrointestinal: Positive for abdominal pain and constipation (from oral iron).  Genitourinary: Negative.     Musculoskeletal: Negative.   Skin: Negative.   Neurological: Negative.   Endo/Heme/Allergies: Bruises/bleeds easily.  Psychiatric/Behavioral: Negative.   All other systems reviewed and are negative. 14 point ROS was done and is otherwise as detailed above or in HPI  PHYSICAL EXAMINATION: ECOG PERFORMANCE STATUS: 1 - Symptomatic but completely ambulatory  Physical Exam  Constitutional: She is oriented to person, place, and time and well-developed, well-nourished, and in no distress.  HENT:  Head: Normocephalic and atraumatic.  Eyes: EOM are normal. Pupils are equal, round, and reactive to light.  Neck: Normal range of motion. Neck supple.  Cardiovascular: Normal rate, regular rhythm and normal heart sounds.   Pulmonary/Chest: Effort normal and breath sounds normal.  Abdominal: Soft. Bowel sounds are normal.  Musculoskeletal: Normal range of motion.  Neurological: She is alert and oriented to person, place, and time. Gait normal.  Skin: Skin is warm and dry.  Nursing note and vitals reviewed.  LABORATORY DATA:  I have reviewed the data as listed Lab Results  Component Value Date   WBC 7.2 03/07/2014   HGB 13.5 03/07/2014   HCT 39.1 03/07/2014   MCV 89.1 03/07/2014   PLT 212 03/07/2014   CMP     Component Value Date/Time   NA 136 05/24/2012 1530   K 4.3 05/24/2012 1530   CL 101 05/24/2012 1530   CO2 26 05/24/2012 1530   GLUCOSE 169 (H) 05/24/2012 1530   BUN 22 (H) 05/24/2012 1530   CREATININE 1.33 (H) 05/24/2012 1530   CALCIUM 8.8 05/24/2012 1530   PROT 6.8 05/24/2012 1530   ALBUMIN 2.8 (L) 05/24/2012 1530   AST 42 (H) 05/24/2012 1530   ALT 47 05/24/2012 1530   ALKPHOS 57 05/24/2012 1530   BILITOT 0.5 05/24/2012 1530   GFRNONAA 38 (L) 05/24/2012 1530   GFRAA 44 (L) 05/24/2012 1530   Outside Labs 06/26/2016 Albumin, Serum  3.2 (L) Sodium, Serum 133 (L) Chloride, Serum 93 (L) Creatinine, Serum 1.35 (H)  Platelets  75 (L) Lymphs (Abs)  3.6 (H) Monocytes  (Abs) 1.3 (H)  Hgb A1C  6.9 (H)  HDL Chol  35 (L)  ASSESSMENT & PLAN:  Cancer Staging No matching staging information was found for the patient. No problem-specific Assessment & Plan notes found for this encounter. Thrombocytopenia Mild normocytic anemia  Will perform a thrombocytopenia/anemia workup with labs as stated below.  Abdominal US ordered to rule out splenomegaly.  I have recommended MiraLax for her constipation.   She will return for follow up in 4 weeks to review lab results and to discuss the next plan of care.   ORDERS PLACED FOR THIS ENCOUNTER: Orders Placed This Encounter  Procedures  . CBC with Differential  . Comprehensive metabolic panel  . Lactate dehydrogenase  .  Pathologist smear review  . Multiple Myeloma Panel (SPEP&IFE w/QIG)  . Vitamin B12  . Folate  . Methylmalonic acid, serum  . [facility] SYNDROME (MDS) PANEL  . Erythropoietin    MEDICATIONS PRESCRIBED THIS ENCOUNTER: No orders of the defined types were placed in this encounter.  All questions were answered. The patient knows to call the clinic with any problems, questions or concerns.  This document serves as a record of services personally performed by Twana First, MD. It was created on her behalf by Martinique Casey, a trained medical scribe. The creation of this record is based on the scribe's personal observations and the provider's statements to them. This document has been checked and approved by the attending provider.  I have reviewed the above documentation for accuracy and completeness and I agree with the above.  This note was electronically signed.    Martinique M Casey  09/20/2016 1:16 PM

## 2016-09-20 NOTE — Patient Instructions (Signed)
[facility] at [facility] Discharge Instructions  RECOMMENDATIONS MADE BY THE CONSULTANT AND ANY TEST RESULTS WILL BE SENT TO YOUR REFERRING PHYSICIAN.  You were seen today by Dr. Ralene CorkLouise Zhou We will schedule abdominal ultrasound Follow up in 4 weeks  See Amy up front for appointments   Thank you for choosing [facility] at [facility] to provide your oncology and hematology care.  To afford each patient quality time with our provider, please arrive at least 15 minutes before your scheduled appointment time.    If you have a lab appointment with the Cancer Center please come in thru the  Main Entrance and check in at the main information desk  You need to re-schedule your appointment should you arrive 10 or more minutes late.  We strive to give you quality time with our providers, and arriving late affects you and other patients whose appointments are after yours.  Also, if you no show three or more times for appointments you may be dismissed from the clinic at the providers discretion.     Again, thank you for choosing [facility].  Our hope is that these requests will decrease the amount of time that you wait before being seen by our physicians.       _____________________________________________________________  Should you have questions after your visit to [facility], please contact our office at [phone] between the hours of 8:30 a.m. and 4:30 p.m.  Voicemails left after 4:30 p.m. will not be returned until the following business day.  For prescription refill requests, have your pharmacy contact our office.       Resources For Cancer Patients and their Caregivers ? American Cancer Society: Can assist with transportation, wigs, general needs, runs Look Good Feel Better.        [phone] ? Cancer Care: Provides financial assistance, online support groups, medication/co-pay assistance.   1-800-813-HOPE [phone](4673) ? [facility] Assists [place] cancer patients and their families through emotional , educational and financial support.  [phone] [phone] ? [place] DSS Where to apply for food stamps, Medicaid and utility assistance. [phone] ? RCATS: Transportation to medical appointments. [phone] ? Social Security Administration: May apply for disability if have a Stage IV cancer. [phone] ? [place] Aging, Disability and Transit Services: Assists with nutrition, care and transit needs. [phone]  Cancer Center Support Programs: @10RELATIVEDAYS @ > Cancer Support Group  2nd Tuesday of the month 1pm-2pm, Journey Room  > Creative Journey  3rd Tuesday of the month 1130am-1pm, Journey Room  > Look Good Feel Better  1st Wednesday of the month 10am-12 noon, Journey Room (Call American Cancer Society to register [phone])

## 2016-09-21 LAB — ERYTHROPOIETIN: ERYTHROPOIETIN: 9.3 m[IU]/mL (ref 2.6–18.5)

## 2016-09-21 LAB — FOLATE: Folate: 103.8 ng/mL (ref >5.9)

## 2016-09-22 LAB — MULTIPLE MYELOMA PANEL, SERUM
ALBUMIN SERPL ELPH-MCNC: 2.9 g/dL (ref 2.9–4.4)
ALPHA 1: 0.2 g/dL (ref 0.0–0.4)
Albumin/Glob SerPl: 0.8 (ref 0.7–1.7)
Alpha2 Glob SerPl Elph-Mcnc: 0.7 g/dL (ref 0.4–1.0)
B-Globulin SerPl Elph-Mcnc: 1.2 g/dL (ref 0.7–1.3)
Gamma Glob SerPl Elph-Mcnc: 1.9 g/dL — ABNORMAL HIGH (ref 0.4–1.8)
Globulin, Total: 4 g/dL — ABNORMAL HIGH (ref 2.2–3.9)
IGA: 557 mg/dL — AB (ref 64–422)
IgG (Immunoglobin G), Serum: 1749 mg/dL — ABNORMAL HIGH (ref 700–1600)
IgM, Serum: 118 mg/dL (ref 26–217)
Total Protein ELP: 6.9 g/dL (ref 6.0–8.5)

## 2016-09-22 LAB — METHYLMALONIC ACID, SERUM: METHYLMALONIC ACID, QUANTITATIVE: 521 nmol/L — AB (ref 0–378)

## 2016-09-25 NOTE — Telephone Encounter (Signed)
Error

## 2016-10-18 ENCOUNTER — Other Ambulatory Visit (HOSPITAL_COMMUNITY): Payer: BC Managed Care – PPO

## 2016-10-18 ENCOUNTER — Encounter (HOSPITAL_COMMUNITY): Payer: Self-pay

## 2016-10-18 ENCOUNTER — Encounter (HOSPITAL_COMMUNITY): Payer: Medicare Other | Attending: Oncology | Admitting: Oncology

## 2016-10-18 VITALS — BP 150/54 | HR 64 | Temp 98.1°F | Resp 20 | Wt 159.7 lb

## 2016-10-18 DIAGNOSIS — D649 Anemia, unspecified: Secondary | ICD-10-CM | POA: Diagnosis not present

## 2016-10-18 DIAGNOSIS — R5383 Other fatigue: Secondary | ICD-10-CM | POA: Insufficient documentation

## 2016-10-18 DIAGNOSIS — I509 Heart failure, unspecified: Secondary | ICD-10-CM | POA: Insufficient documentation

## 2016-10-18 DIAGNOSIS — E785 Hyperlipidemia, unspecified: Secondary | ICD-10-CM | POA: Insufficient documentation

## 2016-10-18 DIAGNOSIS — I130 Hypertensive heart and chronic kidney disease with heart failure and stage 1 through stage 4 chronic kidney disease, or unspecified chronic kidney disease: Secondary | ICD-10-CM | POA: Insufficient documentation

## 2016-10-18 DIAGNOSIS — R109 Unspecified abdominal pain: Secondary | ICD-10-CM | POA: Insufficient documentation

## 2016-10-18 DIAGNOSIS — K219 Gastro-esophageal reflux disease without esophagitis: Secondary | ICD-10-CM | POA: Insufficient documentation

## 2016-10-18 DIAGNOSIS — N189 Chronic kidney disease, unspecified: Secondary | ICD-10-CM | POA: Insufficient documentation

## 2016-10-18 DIAGNOSIS — Z888 Allergy status to other drugs, medicaments and biological substances status: Secondary | ICD-10-CM | POA: Insufficient documentation

## 2016-10-18 DIAGNOSIS — K5900 Constipation, unspecified: Secondary | ICD-10-CM | POA: Insufficient documentation

## 2016-10-18 DIAGNOSIS — E1122 Type 2 diabetes mellitus with diabetic chronic kidney disease: Secondary | ICD-10-CM | POA: Insufficient documentation

## 2016-10-18 DIAGNOSIS — D696 Thrombocytopenia, unspecified: Secondary | ICD-10-CM | POA: Insufficient documentation

## 2016-10-18 DIAGNOSIS — Z7984 Long term (current) use of oral hypoglycemic drugs: Secondary | ICD-10-CM | POA: Insufficient documentation

## 2016-10-18 DIAGNOSIS — M109 Gout, unspecified: Secondary | ICD-10-CM | POA: Insufficient documentation

## 2016-10-18 DIAGNOSIS — Z79899 Other long term (current) drug therapy: Secondary | ICD-10-CM | POA: Insufficient documentation

## 2016-10-18 DIAGNOSIS — E039 Hypothyroidism, unspecified: Secondary | ICD-10-CM | POA: Insufficient documentation

## 2016-10-18 NOTE — Progress Notes (Signed)
[facility]  PROGRESS NOTE  Patient Care Team: Abran Richard, PA-C as PCP - General (Physician Assistant)  CHIEF COMPLAINTS/PURPOSE OF CONSULTATION:  Thrombocytopenia   HISTORY OF PRESENTING ILLNESS:  Julia Maynard 83 y.o. female is here for f/u of thrombocytopenia. Platelet count on 06/26/2016 was 75k.   She is doing well today. Her daughter says she has a UTI currently and is on antibiotics. Denies chest pain, SOB, abdominal pain, fever, or any other concerns.    MEDICAL HISTORY:  Past Medical History:  Diagnosis Date  . Cataract   . CHF (congestive heart failure) (HCC)    EF 70% in 2010.  Marland Kitchen CKD (chronic kidney disease)   . Diabetes mellitus   . Diabetic neuropathy (HCC) 12/23/2011  . Elevated LFTs 12/2011   Bowel hepatitis panel negative  . GERD (gastroesophageal reflux disease)   . Gout   . Hyperlipidemia   . Hypertension   . Hypomagnesemia 12/2011  . Hypothyroidism   . Rhabdomyolysis   . Urinary tract infection     SURGICAL HISTORY: Past Surgical History:  Procedure Laterality Date  . CHOLECYSTECTOMY      SOCIAL HISTORY: Social History   Social History  . Marital status: Widowed    Spouse name: N/A  . Number of children: N/A  . Years of education: N/A   Occupational History  . Not on file.   Social History Main Topics  . Smoking status: Never Smoker  . Smokeless tobacco: Current User    Types: Snuff  . Alcohol use No  . Drug use: No  . Sexual activity: Not on file   Other Topics Concern  . Not on file   Social History Narrative  . No narrative on file    FAMILY HISTORY: History reviewed. No pertinent family history.  ALLERGIES:  is allergic to ace inhibitors; benadryl [diphenhydramine]; cephalexin; lasix [furosemide]; nitrofuran derivatives; nystatin-triamcinolone; prednisone; sulfa antibiotics; and angiotensin receptor blockers.  MEDICATIONS:  Current Outpatient Prescriptions  Medication Sig Dispense Refill  . amLODipine  (NORVASC) 10 MG tablet Take 10 mg by mouth every morning.     . cholecalciferol (VITAMIN D) 1000 UNITS tablet Take 1,000 Units by mouth every morning.     . colchicine 0.6 MG tablet Take 0.6 mg by mouth every morning.     . fluconazole (DIFLUCAN) 150 MG tablet Take one tablet today, may repeat in 1 week if needed. 2 tablet 0  . iron polysaccharides (POLY-IRON 150) 150 MG capsule Take 150 mg by mouth every morning.     Marland Kitchen LACTOBACILLUS PO Take 1 tablet by mouth every morning.     Marland Kitchen levothyroxine (SYNTHROID, LEVOTHROID) 75 MCG tablet Take 75 mcg by mouth daily.    . magnesium 30 MG tablet Take 30 mg by mouth every morning.    . metoprolol (LOPRESSOR) 50 MG tablet Take 50 mg by mouth 2 (two) times daily.    Marland Kitchen nystatin (MYCOSTATIN/NYSTOP) 100000 UNIT/GM POWD Apply to rash 2 or 3 times daily. 30 g 0  . calcium-vitamin D (OSCAL WITH D) 500-200 MG-UNIT per tablet Take 1 tablet by mouth every morning.    Marland Kitchen glimepiride (AMARYL) 4 MG tablet Take 1 tablet (4 mg total) by mouth daily with breakfast. HOLD FOR CAPILLARY BLOOD GLUCOSE OF LESS THAN 120.    . omeprazole (PRILOSEC) 20 MG capsule Take 20 mg by mouth every morning.     No current facility-administered medications for this visit.    Review of Systems  Constitutional:  Negative.  Negative for fever.  HENT: Negative.   Eyes: Negative.   Respiratory: Negative.  Negative for shortness of breath.   Cardiovascular: Negative.  Negative for chest pain.  Gastrointestinal: Negative.  Negative for abdominal pain.  Genitourinary:       UTI  Musculoskeletal: Negative.   Skin: Negative.   Neurological: Negative.   Endo/Heme/Allergies: Negative.   Psychiatric/Behavioral: Negative.   All other systems reviewed and are negative. 14 point ROS was done and is otherwise as detailed above or in HPI  PHYSICAL EXAMINATION: ECOG PERFORMANCE STATUS: 1 - Symptomatic but completely ambulatory   Vitals:   10/18/16 1409  BP: (!) 150/54  Pulse: 64  Resp: 20    Temp: 98.1 F (36.7 C)    Physical Exam  Constitutional: She is oriented to person, place, and time and well-developed, well-nourished, and in no distress.  HENT:  Head: Normocephalic and atraumatic.  Eyes: EOM are normal. Pupils are equal, round, and reactive to light.  Neck: Normal range of motion. Neck supple.  Cardiovascular: Normal rate, regular rhythm and normal heart sounds.   Pulmonary/Chest: Effort normal and breath sounds normal.  Abdominal: Soft. Bowel sounds are normal.  Musculoskeletal: Normal range of motion.  Neurological: She is alert and oriented to person, place, and time. Gait normal.  Skin: Skin is warm and dry.  Nursing note and vitals reviewed.  LABORATORY DATA:  I have reviewed the data as listed Lab Results  Component Value Date   WBC 7.4 09/20/2016   HGB 12.9 09/20/2016   HCT 37.8 09/20/2016   MCV 88.9 09/20/2016   PLT 162 09/20/2016   CMP     Component Value Date/Time   NA 132 (L) 09/20/2016 1344   NA 136 05/24/2012 1530   K 3.9 09/20/2016 1344   K 4.3 05/24/2012 1530   CL 99 (L) 09/20/2016 1344   CL 101 05/24/2012 1530   CO2 27 09/20/2016 1344   CO2 26 05/24/2012 1530   GLUCOSE 179 (H) 09/20/2016 1344   GLUCOSE 169 (H) 05/24/2012 1530   BUN 18 09/20/2016 1344   BUN 22 (H) 05/24/2012 1530   CREATININE 1.30 (H) 09/20/2016 1344   CREATININE 1.33 (H) 05/24/2012 1530   CALCIUM 9.0 09/20/2016 1344   CALCIUM 8.8 05/24/2012 1530   PROT 7.1 09/20/2016 1344   PROT 6.8 05/24/2012 1530   ALBUMIN 3.1 (L) 09/20/2016 1344   ALBUMIN 2.8 (L) 05/24/2012 1530   AST 34 09/20/2016 1344   AST 42 (H) 05/24/2012 1530   ALT 18 09/20/2016 1344   ALT 47 05/24/2012 1530   ALKPHOS 37 (L) 09/20/2016 1344   ALKPHOS 57 05/24/2012 1530   BILITOT 0.5 09/20/2016 1344   BILITOT 0.5 05/24/2012 1530   GFRNONAA 37 (L) 09/20/2016 1344   GFRNONAA 38 (L) 05/24/2012 1530   GFRAA 43 (L) 09/20/2016 1344   GFRAA 44 (L) 05/24/2012 1530   Outside Labs 06/26/2016 Albumin,  Serum  3.2 (L) Sodium, Serum 133 (L) Chloride, Serum 93 (L) Creatinine, Serum 1.35 (H)  Platelets  75 (L) Lymphs (Abs)  3.6 (H) Monocytes (Abs) 1.3 (H)  Hgb A1C  6.9 (H)  HDL Chol  35 (L)   IFE 1  CommentVC   Comments: (NOTE)  An apparent polyclonal gammopathy: IgG and IgA. Kappa and lambda  typing appear increased.     RADIOLOGY: I have reviewed the images below and agree with the reported results  US Abdomen 09/20/2016 IMPRESSION: No evidence of splenomegaly.  ASSESSMENT & PLAN:  Thrombocytopenia- resolved Mild normocytic anemia  I reviewed the abdominal US with the patient. There is no evidence of splenomegaly. Workup is negative, including folate, b12, SPEP/IFE, MMA, erythropoietin.  Labs reviewed with the patient.   I will turn her care back over to her primary care for continued routine monitoring of her CBC.  She will return for follow up PRN. Discharge to PCP. I will copy her PCP in my note.   ORDERS PLACED FOR THIS ENCOUNTER: No orders of the defined types were placed in this encounter.   MEDICATIONS PRESCRIBED THIS ENCOUNTER: No orders of the defined types were placed in this encounter.  All questions were answered. The patient knows to call the clinic with any problems, questions or concerns.  This document serves as a record of services personally performed by Ralene Cork, MD. It was created on her behalf by [place] Casey, a trained medical scribe. The creation of this record is based on the scribe's personal observations and the provider's statements to them. This document has been checked and approved by the attending provider.  I have reviewed the above documentation for accuracy and completeness and I agree with the above.  This note was electronically signed.    [place] M Casey  10/18/2016 2:21 PM

## 2016-10-18 NOTE — Patient Instructions (Signed)
[facility] at [facility] Discharge Instructions  RECOMMENDATIONS MADE BY THE CONSULTANT AND ANY TEST RESULTS WILL BE SENT TO YOUR REFERRING PHYSICIAN.  You were seen today by Dr. Louise Zhou Follow up as needed See Amy up front for appointments   Thank you for choosing [facility] at [facility] to provide your oncology and hematology care.  To afford each patient quality time with our provider, please arrive at least 15 minutes before your scheduled appointment time.    If you have a lab appointment with the Cancer Center please come in thru the  Main Entrance and check in at the main information desk  You need to re-schedule your appointment should you arrive 10 or more minutes late.  We strive to give you quality time with our providers, and arriving late affects you and other patients whose appointments are after yours.  Also, if you no show three or more times for appointments you may be dismissed from the clinic at the providers discretion.     Again, thank you for choosing [facility].  Our hope is that these requests will decrease the amount of time that you wait before being seen by our physicians.       _____________________________________________________________  Should you have questions after your visit to  Cancer Center, please contact our office at [phone] between the hours of 8:30 a.m. and 4:30 p.m.  Voicemails left after 4:30 p.m. will not be returned until the following business day.  For prescription refill requests, have your pharmacy contact our office.       Resources For Cancer Patients and their Caregivers ? American Cancer Society: Can assist with transportation, wigs, general needs, runs Look Good Feel Better.        1-888-227-6333 ? Cancer Care: Provides financial assistance, online support groups, medication/co-pay assistance.  1-800-813-HOPE (4673) ? [facility] Assists [place] cancer patients and their families through emotional , educational and financial support.  336-427-4357 ? [place] DSS Where to apply for food stamps, Medicaid and utility assistance. 336-342-1394 ? RCATS: Transportation to medical appointments. 336-347-2287 ? Social Security Administration: May apply for disability if have a Stage IV cancer. 336-342-7796 1-800-772-1213 ? [place] Aging, Disability and Transit Services: Assists with nutrition, care and transit needs. 336-349-2343  Cancer Center Support Programs: @10RELATIVEDAYS@ > Cancer Support Group  2nd Tuesday of the month 1pm-2pm, Journey Room  > Creative Journey  3rd Tuesday of the month 1130am-1pm, Journey Room  > Look Good Feel Better  1st Wednesday of the month 10am-12 noon, Journey Room (Call American Cancer Society to register 1-800-395-5775)    

## 2017-02-09 ENCOUNTER — Encounter (HOSPITAL_COMMUNITY): Payer: Self-pay | Admitting: *Deleted

## 2017-02-09 DIAGNOSIS — N183 Chronic kidney disease, stage 3 (moderate): Secondary | ICD-10-CM | POA: Diagnosis present

## 2017-02-09 DIAGNOSIS — R570 Cardiogenic shock: Secondary | ICD-10-CM | POA: Diagnosis not present

## 2017-02-09 DIAGNOSIS — N179 Acute kidney failure, unspecified: Secondary | ICD-10-CM | POA: Diagnosis present

## 2017-02-09 DIAGNOSIS — J189 Pneumonia, unspecified organism: Secondary | ICD-10-CM | POA: Diagnosis present

## 2017-02-09 DIAGNOSIS — Z515 Encounter for palliative care: Secondary | ICD-10-CM | POA: Diagnosis present

## 2017-02-09 DIAGNOSIS — D696 Thrombocytopenia, unspecified: Secondary | ICD-10-CM | POA: Diagnosis present

## 2017-02-09 DIAGNOSIS — I214 Non-ST elevation (NSTEMI) myocardial infarction: Secondary | ICD-10-CM | POA: Diagnosis not present

## 2017-02-09 DIAGNOSIS — E039 Hypothyroidism, unspecified: Secondary | ICD-10-CM | POA: Diagnosis present

## 2017-02-09 DIAGNOSIS — I5032 Chronic diastolic (congestive) heart failure: Secondary | ICD-10-CM | POA: Diagnosis present

## 2017-02-09 DIAGNOSIS — E1122 Type 2 diabetes mellitus with diabetic chronic kidney disease: Secondary | ICD-10-CM | POA: Diagnosis present

## 2017-02-09 DIAGNOSIS — R7989 Other specified abnormal findings of blood chemistry: Secondary | ICD-10-CM | POA: Diagnosis present

## 2017-02-09 DIAGNOSIS — M7989 Other specified soft tissue disorders: Secondary | ICD-10-CM | POA: Diagnosis present

## 2017-02-09 DIAGNOSIS — Z888 Allergy status to other drugs, medicaments and biological substances status: Secondary | ICD-10-CM

## 2017-02-09 DIAGNOSIS — R059 Cough, unspecified: Secondary | ICD-10-CM

## 2017-02-09 DIAGNOSIS — I10 Essential (primary) hypertension: Secondary | ICD-10-CM | POA: Diagnosis present

## 2017-02-09 DIAGNOSIS — R748 Abnormal levels of other serum enzymes: Secondary | ICD-10-CM | POA: Diagnosis not present

## 2017-02-09 DIAGNOSIS — E1142 Type 2 diabetes mellitus with diabetic polyneuropathy: Secondary | ICD-10-CM | POA: Diagnosis present

## 2017-02-09 DIAGNOSIS — K219 Gastro-esophageal reflux disease without esophagitis: Secondary | ICD-10-CM | POA: Diagnosis present

## 2017-02-09 DIAGNOSIS — E1143 Type 2 diabetes mellitus with diabetic autonomic (poly)neuropathy: Secondary | ICD-10-CM | POA: Diagnosis present

## 2017-02-09 DIAGNOSIS — E785 Hyperlipidemia, unspecified: Secondary | ICD-10-CM | POA: Diagnosis present

## 2017-02-09 DIAGNOSIS — R05 Cough: Secondary | ICD-10-CM

## 2017-02-09 DIAGNOSIS — R197 Diarrhea, unspecified: Secondary | ICD-10-CM

## 2017-02-09 DIAGNOSIS — R0902 Hypoxemia: Secondary | ICD-10-CM | POA: Diagnosis not present

## 2017-02-09 DIAGNOSIS — E860 Dehydration: Secondary | ICD-10-CM | POA: Diagnosis present

## 2017-02-09 DIAGNOSIS — G9340 Encephalopathy, unspecified: Secondary | ICD-10-CM | POA: Diagnosis present

## 2017-02-09 DIAGNOSIS — R778 Other specified abnormalities of plasma proteins: Secondary | ICD-10-CM

## 2017-02-09 DIAGNOSIS — F0390 Unspecified dementia without behavioral disturbance: Secondary | ICD-10-CM | POA: Diagnosis present

## 2017-02-09 DIAGNOSIS — R079 Chest pain, unspecified: Secondary | ICD-10-CM | POA: Diagnosis present

## 2017-02-09 DIAGNOSIS — K3184 Gastroparesis: Secondary | ICD-10-CM | POA: Diagnosis present

## 2017-02-09 DIAGNOSIS — Z882 Allergy status to sulfonamides status: Secondary | ICD-10-CM

## 2017-02-09 DIAGNOSIS — D631 Anemia in chronic kidney disease: Secondary | ICD-10-CM | POA: Diagnosis present

## 2017-02-09 DIAGNOSIS — Z79899 Other long term (current) drug therapy: Secondary | ICD-10-CM

## 2017-02-09 DIAGNOSIS — I420 Dilated cardiomyopathy: Secondary | ICD-10-CM | POA: Diagnosis present

## 2017-02-09 DIAGNOSIS — I130 Hypertensive heart and chronic kidney disease with heart failure and stage 1 through stage 4 chronic kidney disease, or unspecified chronic kidney disease: Secondary | ICD-10-CM | POA: Diagnosis present

## 2017-02-09 DIAGNOSIS — F17290 Nicotine dependence, other tobacco product, uncomplicated: Secondary | ICD-10-CM | POA: Diagnosis present

## 2017-02-09 HISTORY — DX: Pneumonia, unspecified organism: J18.9

## 2017-02-09 LAB — BASIC METABOLIC PANEL
ANION GAP: 7 (ref 5–15)
BUN: 22 mg/dL — AB (ref 6–20)
CALCIUM: 8.6 mg/dL — AB (ref 8.9–10.3)
CO2: 24 mmol/L (ref 22–32)
Chloride: 102 mmol/L (ref 101–111)
Creatinine, Ser: 1.87 mg/dL — ABNORMAL HIGH (ref 0.44–1.00)
GFR calc Af Amer: 28 mL/min — ABNORMAL LOW (ref >60)
GFR, EST NON AFRICAN AMERICAN: 24 mL/min — AB (ref >60)
Glucose, Bld: 126 mg/dL — ABNORMAL HIGH (ref 65–99)
POTASSIUM: 4 mmol/L (ref 3.5–5.1)
SODIUM: 133 mmol/L — AB (ref 135–145)

## 2017-02-09 LAB — CBC WITH DIFFERENTIAL/PLATELET
BASOS ABS: 0 10*3/uL (ref 0.0–0.1)
Basophils Relative: 0 %
Eosinophils Absolute: 0.1 10*3/uL (ref 0.0–0.7)
Eosinophils Relative: 2 %
HCT: 37.4 % (ref 36.0–46.0)
Hemoglobin: 12.8 g/dL (ref 12.0–15.0)
LYMPHS ABS: 2.3 10*3/uL (ref 0.7–4.0)
LYMPHS PCT: 30 %
MCH: 29.7 pg (ref 26.0–34.0)
MCHC: 34.2 g/dL (ref 30.0–36.0)
MCV: 86.8 fL (ref 78.0–100.0)
Monocytes Absolute: 0.8 10*3/uL (ref 0.1–1.0)
Monocytes Relative: 10 %
Neutro Abs: 4.5 10*3/uL (ref 1.7–7.7)
Neutrophils Relative %: 58 %
Platelets: 134 10*3/uL — ABNORMAL LOW (ref 150–400)
RBC: 4.31 MIL/uL (ref 3.87–5.11)
RDW: 15.1 % (ref 11.5–15.5)
WBC: 7.7 10*3/uL (ref 4.0–10.5)

## 2017-02-09 LAB — TROPONIN I: TROPONIN I: 0.75 ng/mL — AB (ref <0.03)

## 2017-02-09 MED ORDER — ASPIRIN 325 MG PO TABS
325.0000 mg | ORAL_TABLET | Freq: Once | ORAL | Status: AC
  Administered 2017-02-09: 325 mg via ORAL
  Filled 2017-02-09: qty 1

## 2017-02-09 MED ORDER — ONDANSETRON HCL 4 MG/2ML IJ SOLN
2.0000 mg | Freq: Once | INTRAMUSCULAR | Status: AC
  Administered 2017-02-09: 2 mg via INTRAVENOUS
  Filled 2017-02-09: qty 2

## 2017-02-09 MED ORDER — SODIUM CHLORIDE 0.9 % IV BOLUS (SEPSIS)
1000.0000 mL | Freq: Once | INTRAVENOUS | Status: AC
  Administered 2017-02-09: 1000 mL via INTRAVENOUS

## 2017-02-09 NOTE — ED Notes (Signed)
Date and time results received: Apr 14, 2017 2246   Test: Triponin Critical Value: 0.75  Name of Provider Notified: EDP, Dr Adriana Simasook  Orders Received? Or Actions Taken?: N/A

## 2017-02-09 NOTE — ED Triage Notes (Addendum)
Pt was diagnosed with pneumonia on 02/02/17 and was placed on amoxicillin. Pt has continued productive cough (brownish in color), decreased appetite, and lower leg edema. Pt c/o chest pain earlier in the week that is worse when she lies down, but none today. A mobile unit came to the pt's house and did a follow up chest xray and according to the family, the xray was negative.

## 2017-02-09 NOTE — ED Provider Notes (Signed)
[facility]-EMERGENCY DEPT Provider Note   CSN: [id number] Arrival date & time: 01/27/2017  2029     History   Chief Complaint Chief Complaint  Patient presents with  . Emesis    HPI Reather Laurencella Tammen is a 83 y.o. female.  HPI  Past Medical History:  Diagnosis Date  . Cataract   . CHF (congestive heart failure) (HCC)    EF 70% in 2010.  Marland Kitchen. CKD (chronic kidney disease)   . Diabetes mellitus   . Diabetic neuropathy (HCC) 12/23/2011  . Elevated LFTs 12/2011   Bowel hepatitis panel negative  . GERD (gastroesophageal reflux disease)   . Gout   . Hyperlipidemia   . Hypertension   . Hypomagnesemia 12/2011  . Hypothyroidism   . Pneumonia   . Rhabdomyolysis   . Urinary tract infection     Patient Active Problem List   Diagnosis Date Noted  . Thrombocytopenia (HCC) 09/20/2016  . Anemia 09/20/2016  . Hyperkalemia 12/25/2011  . Hypomagnesemia 12/23/2011  . Diabetic neuropathy (HCC) 12/23/2011  . Nausea and vomiting 12/22/2011  . Diarrhea 12/22/2011  . Dehydration 12/22/2011  . Hypokalemia 12/22/2011  . Elevated LFTs 12/22/2011  . Pyuria 12/22/2011  . Acute renal failure (HCC) 12/22/2011  . Hypocalcemia 12/22/2011  . Debility 11/08/2011  . Rhabdomyolysis 11/07/2011  . Hyponatremia 11/07/2011  . HTN (hypertension) 11/07/2011  . Type II or unspecified type diabetes mellitus without mention of complication, not stated as uncontrolled 11/07/2011  . Gout 11/07/2011  . Hyperlipidemia 11/07/2011  . Hypothyroid 11/07/2011  . GERD (gastroesophageal reflux disease) 11/07/2011    Past Surgical History:  Procedure Laterality Date  . CHOLECYSTECTOMY      OB History    No data available       Home Medications    Prior to Admission medications   Medication Sig Start Date End Date Taking? Authorizing Provider  amLODipine (NORVASC) 10 MG tablet Take 10 mg by mouth every morning.     [provider]  calcium-vitamin D (OSCAL WITH D) 500-200 MG-UNIT per tablet Take 1 tablet by  mouth every morning. 12/25/11 03/07/14  Elliot CousinFisher, Denise, MD  cholecalciferol (VITAMIN D) 1000 UNITS tablet Take 1,000 Units by mouth every morning.     [provider]  colchicine 0.6 MG tablet Take 0.6 mg by mouth every morning.     [provider]  fluconazole (DIFLUCAN) 150 MG tablet Take one tablet today, may repeat in 1 week if needed. 03/07/14   Ivery QualeBryant, Hobson, PA-C  glimepiride (AMARYL) 4 MG tablet Take 1 tablet (4 mg total) by mouth daily with breakfast. HOLD FOR CAPILLARY BLOOD GLUCOSE OF LESS THAN 120. 12/25/11 03/07/14  Elliot CousinFisher, Denise, MD  iron polysaccharides (POLY-IRON 150) 150 MG capsule Take 150 mg by mouth every morning.     [provider]  LACTOBACILLUS PO Take 1 tablet by mouth every morning.     [provider]  levothyroxine (SYNTHROID, LEVOTHROID) 75 MCG tablet Take 75 mcg by mouth daily.    [provider]  magnesium 30 MG tablet Take 30 mg by mouth every morning.    [provider]  metoprolol (LOPRESSOR) 50 MG tablet Take 50 mg by mouth 2 (two) times daily.    [provider]  nystatin (MYCOSTATIN/NYSTOP) 100000 UNIT/GM POWD Apply to rash 2 or 3 times daily. 03/07/14   Ivery QualeBryant, Hobson, PA-C  omeprazole (PRILOSEC) 20 MG capsule Take 20 mg by mouth every morning. 12/25/11 03/07/14  Elliot CousinFisher, Denise, MD    Family History  History reviewed. No pertinent family history.  Social History Social History  Substance Use Topics  . Smoking status: Never Smoker  . Smokeless tobacco: Current User    Types: Snuff  . Alcohol use No     Allergies   Ace inhibitors; Benadryl [diphenhydramine]; Cephalexin; Lasix [furosemide]; Nitrofuran derivatives; Nystatin-triamcinolone; Prednisone; Sulfa antibiotics; and Angiotensin receptor blockers   Review of Systems Review of Systems   Physical Exam Updated Vital Signs BP 127/74 (BP Location: Left Arm)   Pulse 77   Temp 98 F (36.7 C) (Oral)   Resp (!) 22   Ht 5\' 2"  (1.575 m)    Wt 72.6 kg (160 lb)   SpO2 95%   BMI 29.26 kg/m   Physical Exam   ED Treatments / Results  Labs (all labs ordered are listed, but only abnormal results are displayed) Labs Reviewed  CBC WITH DIFFERENTIAL/PLATELET - Abnormal; Notable for the following:       Result Value   Platelets 134 (*)    All other components within normal limits  BASIC METABOLIC PANEL - Abnormal; Notable for the following:    Sodium 133 (*)    Glucose, Bld 126 (*)    BUN 22 (*)    Creatinine, Ser 1.87 (*)    Calcium 8.6 (*)    GFR calc non Af Amer 24 (*)    GFR calc Af Amer 28 (*)    All other components within normal limits  TROPONIN I - Abnormal; Notable for the following:    Troponin I 0.75 (*)    All other components within normal limits    EKG  EKG Interpretation None       Radiology No results found.  Procedures Procedures (including critical care time)  Medications Ordered in ED Medications  aspirin tablet 325 mg (not administered)  sodium chloride 0.9 % bolus 1,000 mL (1,000 mLs Intravenous New Bag/Given 29-Dec-2016 2125)  ondansetron (ZOFRAN) injection 2 mg (2 mg Intravenous Given 29-Dec-2016 2125)     Initial Impression / Assessment and Plan / ED Course  I have reviewed the triage vital signs and the nursing notes.  Pertinent labs & imaging results that were available during my care of the patient were reviewed by me and considered in my medical decision making (see chart for details).     Patient presents with general malaise and chest pain. EKG shows no acute changes. Troponin 0.75. Discussed with cardi[facility] Dr Clarnce FlockFudim.  Will admit to hospital service and [facility].  Final Clinical Impressions(s) / ED Diagnoses   Final diagnoses:  Chest pain, unspecified type  Elevated troponin    New Prescriptions New Prescriptions   No medications on file     Donnetta Hutchingook, Kinzie Wickes, MD 02/10/17 [phone]

## 2017-02-09 NOTE — ED Provider Notes (Signed)
[facility]-EM[facility]Y DEPT Provider Note   CSN: [id number] Arrival date & time: 02/02/2017  2029     History   Chief Complaint Chief Complaint  Patient presents with  . Emesis    HPI Julia Maynard is a 83 y.o. female.  Level V caveat for mild dementia. Patient diagnosed with pneumonia on 02/02/17 and Rx with amoxicillin. She has a continued cough, poor appetite, intermittent chest pain, worse tonight. Repeat chest x-ray performed today in the home was "negative". She continues to have chest pain intermittently not associated with any activity. No frank dyspnea, diaphoresis, nausea. She has multiple cardiac risk factors well documented in the chart.      Past Medical History:  Diagnosis Date  . Cataract   . CHF (congestive heart failure) (HCC)    EF 70% in 2010.  Marland Kitchen CKD (chronic kidney disease)   . Diabetes mellitus   . Diabetic neuropathy (HCC) 12/23/2011  . Elevated LFTs 12/2011   Bowel hepatitis panel negative  . GERD (gastroesophageal reflux disease)   . Gout   . Hyperlipidemia   . Hypertension   . Hypomagnesemia 12/2011  . Hypothyroidism   . Pneumonia   . Rhabdomyolysis   . Urinary tract infection     Patient Active Problem List   Diagnosis Date Noted  . Thrombocytopenia (HCC) 09/20/2016  . Anemia 09/20/2016  . Hyperkalemia 12/25/2011  . Hypomagnesemia 12/23/2011  . Diabetic neuropathy (HCC) 12/23/2011  . Nausea and vomiting 12/22/2011  . Diarrhea 12/22/2011  . Dehydration 12/22/2011  . Hypokalemia 12/22/2011  . Elevated LFTs 12/22/2011  . Pyuria 12/22/2011  . Acute renal failure (HCC) 12/22/2011  . Hypocalcemia 12/22/2011  . Debility 11/08/2011  . Rhabdomyolysis 11/07/2011  . Hyponatremia 11/07/2011  . HTN (hypertension) 11/07/2011  . Type II or unspecified type diabetes mellitus without mention of complication, not stated as uncontrolled 11/07/2011  . Gout 11/07/2011  . Hyperlipidemia 11/07/2011  . Hypothyroid 11/07/2011  . GERD (gastroesophageal reflux  disease) 11/07/2011    Past Surgical History:  Procedure Laterality Date  . CHOLECYSTECTOMY      OB History    No data available       Home Medications    Prior to Admission medications   Medication Sig Start Date End Date Taking? Authorizing Provider  amLODipine (NORVASC) 10 MG tablet Take 10 mg by mouth every morning.     [provider]  calcium-vitamin D (OSCAL WITH D) 500-200 MG-UNIT per tablet Take 1 tablet by mouth every morning. 12/25/11 03/07/14  Elliot Cousin, MD  cholecalciferol (VITAMIN D) 1000 UNITS tablet Take 1,000 Units by mouth every morning.     [provider]  colchicine 0.6 MG tablet Take 0.6 mg by mouth every morning.     [provider]  fluconazole (DIFLUCAN) 150 MG tablet Take one tablet today, may repeat in 1 week if needed. 03/07/14   Ivery Quale, PA-C  glimepiride (AMARYL) 4 MG tablet Take 1 tablet (4 mg total) by mouth daily with breakfast. HOLD FOR CAPILLARY BLOOD GLUCOSE OF LESS THAN 120. 12/25/11 03/07/14  Elliot Cousin, MD  iron polysaccharides (POLY-IRON 150) 150 MG capsule Take 150 mg by mouth every morning.     [provider]  LACTOBACILLUS PO Take 1 tablet by mouth every morning.     [provider]  levothyroxine (SYNTHROID, LEVOTHROID) 75 MCG tablet Take 75 mcg by mouth daily.    [provider]  magnesium 30 MG tablet Take 30 mg by mouth every morning.  [provider]  metoprolol (LOPRESSOR) 50 MG tablet Take 50 mg by mouth 2 (two) times daily.    [provider]  nystatin (MYCOSTATIN/NYSTOP) 100000 UNIT/GM POWD Apply to rash 2 or 3 times daily. 03/07/14   Ivery QualeBryant, Hobson, PA-C  omeprazole (PRILOSEC) 20 MG capsule Take 20 mg by mouth every morning. 12/25/11 03/07/14  Elliot CousinFisher, Denise, MD    Family History History reviewed. No pertinent family history.  Social History Social History  Substance Use Topics  . Smoking status: Never Smoker  . Smokeless tobacco: Current User     Types: Snuff  . Alcohol use No     Allergies   Ace inhibitors; Benadryl [diphenhydramine]; Cephalexin; Lasix [furosemide]; Nitrofuran derivatives; Nystatin-triamcinolone; Prednisone; Sulfa antibiotics; and Angiotensin receptor blockers   Review of Systems Review of Systems  Unable to perform ROS: Dementia     Physical Exam Updated Vital Signs BP 127/74 (BP Location: Left Arm)   Pulse 77   Temp 98 F (36.7 C) (Oral)   Resp (!) 22   Ht 5\' 2"  (1.575 m)   Wt 72.6 kg (160 lb)   SpO2 95%   BMI 29.26 kg/m   Physical Exam  Constitutional:  Frail, no acute distress  HENT:  Head: Normocephalic and atraumatic.  Eyes: Conjunctivae are normal.  Neck: Neck supple.  Cardiovascular: Normal rate and regular rhythm.   Pulmonary/Chest: Effort normal and breath sounds normal.  Abdominal: Soft. Bowel sounds are normal.  Musculoskeletal: Normal range of motion.  Neurological: She is alert.  Skin: Skin is warm and dry.  Psychiatric:  Pleasant affect  Nursing note and vitals reviewed.    ED Treatments / Results  Labs (all labs ordered are listed, but only abnormal results are displayed) Labs Reviewed  CBC WITH DIFFERENTIAL/PLATELET - Abnormal; Notable for the following:       Result Value   Platelets 134 (*)    All other components within normal limits  BASIC METABOLIC PANEL - Abnormal; Notable for the following:    Sodium 133 (*)    Glucose, Bld 126 (*)    BUN 22 (*)    Creatinine, Ser 1.87 (*)    Calcium 8.6 (*)    GFR calc non Af Amer 24 (*)    GFR calc Af Amer 28 (*)    All other components within normal limits  TROPONIN I - Abnormal; Notable for the following:    Troponin I 0.75 (*)    All other components within normal limits    EKG  EKG Interpretation None       Radiology No results found.  Procedures Procedures (including critical care time)  Medications Ordered in ED Medications  sodium chloride 0.9 % bolus 1,000 mL (1,000 mLs Intravenous New  Bag/Given 05/01/17 2125)  ondansetron (ZOFRAN) injection 2 mg (2 mg Intravenous Given 05/01/17 2125)  aspirin tablet 325 mg (325 mg Oral Given 05/01/17 2348)     Initial Impression / Assessment and Plan / ED Course  I have reviewed the triage vital signs and the nursing notes.  Pertinent labs & imaging results that were available during my care of the patient were reviewed by me and considered in my medical decision making (see chart for details).     Patient is hemodynamically stable. Troponin is elevated at 0.75. EKG shows no acute changes. D[facility] Dr[facility] Clarnce FlockFudim.  Will admit to hospitalist service for [facility].  Final Clinical Impressions(s) / ED Diagnoses   Final diagnoses:  Chest pain, unspecified type  Elevated troponin    New Prescriptions New Prescriptions   No medications on file     Donnetta Hutchingook, Natara Monfort, MD 02/10/17 0003

## 2017-02-09 NOTE — ED Triage Notes (Signed)
Family states pt just "got over pneumonia and she is showing all the signs of dehydration"

## 2017-02-09 NOTE — ED Triage Notes (Signed)
Family also reports she has been having "loose stools."

## 2017-02-10 ENCOUNTER — Observation Stay (HOSPITAL_COMMUNITY): Payer: Medicare Other

## 2017-02-10 ENCOUNTER — Encounter (HOSPITAL_COMMUNITY): Payer: Medicare Other

## 2017-02-10 DIAGNOSIS — N183 Chronic kidney disease, stage 3 (moderate): Secondary | ICD-10-CM | POA: Diagnosis present

## 2017-02-10 DIAGNOSIS — E039 Hypothyroidism, unspecified: Secondary | ICD-10-CM

## 2017-02-10 DIAGNOSIS — N179 Acute kidney failure, unspecified: Secondary | ICD-10-CM | POA: Diagnosis present

## 2017-02-10 DIAGNOSIS — D631 Anemia in chronic kidney disease: Secondary | ICD-10-CM | POA: Diagnosis present

## 2017-02-10 DIAGNOSIS — I5032 Chronic diastolic (congestive) heart failure: Secondary | ICD-10-CM | POA: Diagnosis present

## 2017-02-10 DIAGNOSIS — E1143 Type 2 diabetes mellitus with diabetic autonomic (poly)neuropathy: Secondary | ICD-10-CM | POA: Diagnosis present

## 2017-02-10 DIAGNOSIS — K219 Gastro-esophageal reflux disease without esophagitis: Secondary | ICD-10-CM

## 2017-02-10 DIAGNOSIS — I420 Dilated cardiomyopathy: Secondary | ICD-10-CM | POA: Diagnosis present

## 2017-02-10 DIAGNOSIS — I10 Essential (primary) hypertension: Secondary | ICD-10-CM | POA: Diagnosis not present

## 2017-02-10 DIAGNOSIS — E785 Hyperlipidemia, unspecified: Secondary | ICD-10-CM | POA: Diagnosis present

## 2017-02-10 DIAGNOSIS — R748 Abnormal levels of other serum enzymes: Secondary | ICD-10-CM | POA: Diagnosis present

## 2017-02-10 DIAGNOSIS — J189 Pneumonia, unspecified organism: Secondary | ICD-10-CM | POA: Diagnosis present

## 2017-02-10 DIAGNOSIS — R112 Nausea with vomiting, unspecified: Secondary | ICD-10-CM

## 2017-02-10 DIAGNOSIS — R197 Diarrhea, unspecified: Secondary | ICD-10-CM

## 2017-02-10 DIAGNOSIS — R079 Chest pain, unspecified: Secondary | ICD-10-CM | POA: Diagnosis not present

## 2017-02-10 DIAGNOSIS — G9340 Encephalopathy, unspecified: Secondary | ICD-10-CM | POA: Diagnosis present

## 2017-02-10 DIAGNOSIS — Z515 Encounter for palliative care: Secondary | ICD-10-CM | POA: Diagnosis present

## 2017-02-10 DIAGNOSIS — R0902 Hypoxemia: Secondary | ICD-10-CM | POA: Diagnosis not present

## 2017-02-10 DIAGNOSIS — R570 Cardiogenic shock: Secondary | ICD-10-CM | POA: Diagnosis not present

## 2017-02-10 DIAGNOSIS — E1142 Type 2 diabetes mellitus with diabetic polyneuropathy: Secondary | ICD-10-CM | POA: Diagnosis present

## 2017-02-10 DIAGNOSIS — I214 Non-ST elevation (NSTEMI) myocardial infarction: Secondary | ICD-10-CM | POA: Diagnosis present

## 2017-02-10 DIAGNOSIS — I130 Hypertensive heart and chronic kidney disease with heart failure and stage 1 through stage 4 chronic kidney disease, or unspecified chronic kidney disease: Secondary | ICD-10-CM | POA: Diagnosis present

## 2017-02-10 LAB — CBC
HCT: 34.6 % — ABNORMAL LOW (ref 36.0–46.0)
HEMATOCRIT: 31.6 % — AB (ref 36.0–46.0)
HEMOGLOBIN: 10.7 g/dL — AB (ref 12.0–15.0)
Hemoglobin: 11.7 g/dL — ABNORMAL LOW (ref 12.0–15.0)
MCH: 28.8 pg (ref 26.0–34.0)
MCH: 28.9 pg (ref 26.0–34.0)
MCHC: 33.8 g/dL (ref 30.0–36.0)
MCHC: 33.9 g/dL (ref 30.0–36.0)
MCV: 85.2 fL (ref 78.0–100.0)
MCV: 85.4 fL (ref 78.0–100.0)
PLATELETS: 96 10*3/uL — AB (ref 150–400)
Platelets: 148 10*3/uL — ABNORMAL LOW (ref 150–400)
RBC: 4.06 MIL/uL (ref 3.87–5.11)
RBC: 3.7 MIL/uL — AB (ref 3.87–5.11)
RDW: 15 % (ref 11.5–15.5)
RDW: 14.9 % (ref 11.5–15.5)
WBC: 8.2 10*3/uL (ref 4.0–10.5)
WBC: 8.2 10*3/uL (ref 4.0–10.5)

## 2017-02-10 LAB — CREATININE, SERUM
Creatinine, Ser: 1.77 mg/dL — ABNORMAL HIGH (ref 0.44–1.00)
GFR calc non Af Amer: 25 mL/min — ABNORMAL LOW (ref >60)
GFR, EST AFRICAN AMERICAN: 29 mL/min — AB (ref >60)

## 2017-02-10 LAB — GLUCOSE, CAPILLARY
GLUCOSE-CAPILLARY: 105 mg/dL — AB (ref 65–99)
GLUCOSE-CAPILLARY: 90 mg/dL (ref 65–99)
GLUCOSE-CAPILLARY: 85 mg/dL (ref 65–99)
Glucose-Capillary: 125 mg/dL — ABNORMAL HIGH (ref 65–99)
Glucose-Capillary: 100 mg/dL — ABNORMAL HIGH (ref 65–99)

## 2017-02-10 LAB — TROPONIN I
TROPONIN I: 0.57 ng/mL — AB (ref <0.03)
Troponin I: 2.54 ng/mL (ref <0.03)
Troponin I: 5.44 ng/mL (ref <0.03)

## 2017-02-10 LAB — TSH: TSH: 1.903 u[IU]/mL (ref 0.350–4.500)

## 2017-02-10 LAB — HEPARIN LEVEL (UNFRACTIONATED): Heparin Unfractionated: 0.4 IU/mL (ref 0.30–0.70)

## 2017-02-10 LAB — BRAIN NATRIURETIC PEPTIDE: B NATRIURETIC PEPTIDE 5: 952 pg/mL — AB (ref 0.0–100.0)

## 2017-02-10 LAB — PROCALCITONIN: PROCALCITONIN: 1.37 ng/mL

## 2017-02-10 MED ORDER — LEVOTHYROXINE SODIUM 75 MCG PO TABS
75.0000 ug | ORAL_TABLET | Freq: Every day | ORAL | Status: DC
  Administered 2017-02-10 – 2017-02-11 (×2): 75 ug via ORAL
  Filled 2017-02-10 (×3): qty 1

## 2017-02-10 MED ORDER — MORPHINE SULFATE (PF) 2 MG/ML IV SOLN: 2.0000 mg | INTRAVENOUS | Status: DC | PRN

## 2017-02-10 MED ORDER — NITROGLYCERIN 0.4 MG SL SUBL: 0.4000 mg | SUBLINGUAL_TABLET | SUBLINGUAL | Status: DC | PRN

## 2017-02-10 MED ORDER — PANTOPRAZOLE SODIUM 40 MG PO TBEC
40.0000 mg | DELAYED_RELEASE_TABLET | Freq: Every day | ORAL | Status: DC
  Administered 2017-02-11: 40 mg via ORAL
  Filled 2017-02-10 (×2): qty 1

## 2017-02-10 MED ORDER — METOPROLOL TARTRATE 50 MG PO TABS
50.0000 mg | ORAL_TABLET | Freq: Two times a day (BID) | ORAL | Status: DC
  Administered 2017-02-10 (×2): 50 mg via ORAL
  Filled 2017-02-10 (×3): qty 1

## 2017-02-10 MED ORDER — ENOXAPARIN SODIUM 30 MG/0.3ML ~~LOC~~ SOLN
30.0000 mg | SUBCUTANEOUS | Status: DC
Start: 2017-02-10 — End: 2017-02-10

## 2017-02-10 MED ORDER — MORPHINE SULFATE (PF) 2 MG/ML IV SOLN
INTRAVENOUS | Status: AC
  Filled 2017-02-10: qty 1

## 2017-02-10 MED ORDER — AMLODIPINE BESYLATE 10 MG PO TABS
10.0000 mg | ORAL_TABLET | Freq: Every morning | ORAL | Status: DC
  Filled 2017-02-10: qty 1

## 2017-02-10 MED ORDER — ACETAMINOPHEN 325 MG PO TABS: 650.0000 mg | ORAL_TABLET | Freq: Four times a day (QID) | ORAL | Status: DC | PRN

## 2017-02-10 MED ORDER — HEPARIN (PORCINE) IN NACL 100-0.45 UNIT/ML-% IJ SOLN
12.0000 [IU]/kg/h | INTRAMUSCULAR | Status: DC
  Administered 2017-02-10: 12 [IU]/kg/h via INTRAVENOUS
  Filled 2017-02-10: qty 250

## 2017-02-10 MED ORDER — CALCIUM CARBONATE-VITAMIN D 500-200 MG-UNIT PO TABS
1.0000 | ORAL_TABLET | Freq: Every morning | ORAL | Status: DC
  Administered 2017-02-11: 11:00:00 1 via ORAL
  Filled 2017-02-10 (×3): qty 1

## 2017-02-10 MED ORDER — COLCHICINE 0.6 MG PO TABS
0.6000 mg | ORAL_TABLET | Freq: Every morning | ORAL | Status: DC
  Administered 2017-02-11: 0.6 mg via ORAL
  Filled 2017-02-10 (×3): qty 1

## 2017-02-10 MED ORDER — SODIUM CHLORIDE 0.9 % IV SOLN: INTRAVENOUS | Status: DC

## 2017-02-10 MED ORDER — NITROGLYCERIN 0.4 MG SL SUBL
SUBLINGUAL_TABLET | SUBLINGUAL | Status: AC
  Administered 2017-02-10: 04:00:00
  Filled 2017-02-10: qty 1

## 2017-02-10 MED ORDER — ASPIRIN 325 MG PO TABS
325.0000 mg | ORAL_TABLET | Freq: Every day | ORAL | Status: DC
  Administered 2017-02-10 – 2017-02-11 (×2): 325 mg via ORAL
  Filled 2017-02-10 (×3): qty 1

## 2017-02-10 MED ORDER — HEPARIN BOLUS VIA INFUSION
3000.0000 [IU] | Freq: Once | INTRAVENOUS | Status: AC
  Administered 2017-02-10: 3000 [IU] via INTRAVENOUS

## 2017-02-10 MED ORDER — INSULIN ASPART 100 UNIT/ML ~~LOC~~ SOLN: 0.0000 [IU] | Freq: Three times a day (TID) | SUBCUTANEOUS | Status: DC

## 2017-02-10 MED ORDER — HEPARIN SODIUM (PORCINE) 5000 UNIT/ML IJ SOLN
5000.0000 [IU] | Freq: Three times a day (TID) | INTRAMUSCULAR | Status: DC
  Administered 2017-02-10: 5000 [IU] via SUBCUTANEOUS
  Filled 2017-02-10: qty 1

## 2017-02-10 MED ORDER — SODIUM CHLORIDE 0.9 % IV SOLN
INTRAVENOUS | Status: DC
  Administered 2017-02-10: 04:00:00 via INTRAVENOUS

## 2017-02-10 MED ORDER — ACETAMINOPHEN 325 MG PO TABS
650.0000 mg | ORAL_TABLET | Freq: Four times a day (QID) | ORAL | Status: DC | PRN
  Administered 2017-02-11: 650 mg via ORAL
  Filled 2017-02-10: qty 2

## 2017-02-10 MED ORDER — MORPHINE SULFATE (PF) 2 MG/ML IV SOLN
2.0000 mg | Freq: Once | INTRAVENOUS | Status: AC
  Administered 2017-02-10: 2 mg via INTRAVENOUS

## 2017-02-10 MED ORDER — HEPARIN (PORCINE) IN NACL 100-0.45 UNIT/ML-% IJ SOLN
700.0000 [IU]/h | INTRAMUSCULAR | Status: DC
  Administered 2017-02-10: 700 [IU]/h via INTRAVENOUS
  Filled 2017-02-10: qty 250

## 2017-02-10 MED ORDER — HYDROMORPHONE HCL 1 MG/ML IJ SOLN
0.5000 mg | Freq: Once | INTRAMUSCULAR | Status: AC
  Administered 2017-02-10: 0.5 mg via INTRAVENOUS
  Filled 2017-02-10: qty 0.5

## 2017-02-10 MED ORDER — GI COCKTAIL ~~LOC~~
30.0000 mL | Freq: Four times a day (QID) | ORAL | Status: AC | PRN
  Administered 2017-02-10 – 2017-02-11 (×2): 30 mL via ORAL
  Filled 2017-02-10 (×2): qty 30

## 2017-02-10 MED ORDER — ONDANSETRON HCL 4 MG/2ML IJ SOLN
4.0000 mg | Freq: Four times a day (QID) | INTRAMUSCULAR | Status: DC | PRN
  Administered 2017-02-11 – 2017-02-12 (×2): 4 mg via INTRAVENOUS
  Filled 2017-02-10 (×2): qty 2

## 2017-02-10 MED ORDER — NITROGLYCERIN IN D5W 200-5 MCG/ML-% IV SOLN
5.0000 ug/min | Freq: Once | INTRAVENOUS | Status: AC
  Administered 2017-02-10: 5 ug/min via INTRAVENOUS
  Filled 2017-02-10: qty 250

## 2017-02-10 MED ORDER — SODIUM CHLORIDE 0.9 % IV BOLUS (SEPSIS)
500.0000 mL | Freq: Once | INTRAVENOUS | Status: AC
  Administered 2017-02-10: 500 mL via INTRAVENOUS

## 2017-02-10 MED ORDER — MAGNESIUM OXIDE 400 (241.3 MG) MG PO TABS
200.0000 mg | ORAL_TABLET | Freq: Every morning | ORAL | Status: DC
  Administered 2017-02-11: 200 mg via ORAL
  Filled 2017-02-10: qty 1
  Filled 2017-02-10: qty 0.5
  Filled 2017-02-10: qty 1

## 2017-02-10 MED ORDER — MORPHINE SULFATE (PF) 2 MG/ML IV SOLN
1.0000 mg | INTRAVENOUS | Status: DC | PRN
  Administered 2017-02-10: 1 mg via INTRAVENOUS
  Administered 2017-02-11 (×2): 2 mg via INTRAVENOUS
  Filled 2017-02-10 (×3): qty 1

## 2017-02-10 NOTE — Consult Note (Signed)
Cardiology Consultation:   Patient ID: Julia Maynard; [id number]; 02-20-1932   Admit date: 2016-12-19 Date of Consult: 02/10/2017  Primary Care Provider: Phyllis GingerBaucom, Jenny B, PA-C Primary Cardiologist: New Primary Electrophysiologist:  None    History of Present Illness:   Julia Maynard is a 83 y.o. female with a hx of diastolic CHF who is being seen today for the evaluation of chest pain at the request of Dr Waymon AmatoHongalgi She has been ill since 7/2 Primary issue has been nasuea, vomiting and stomach distress. Clear phlegm. No fever ? Pneumonia 10 days ago and started on antibiotics. Supposed to continue until Monday. She denies chest pain to me. Has some epigastric pain. She has had her GB out but no other GI surgeries. This am just malaise. Lives with daughter with poor functional status. Goes to bathroom with walker. .No SOB/ NO edemea Some pain in legs with ambulation that sounds arthritic  Past Medical History:  Diagnosis Date  . Cataract   . CHF (congestive heart failure) (HCC)    EF 70% in 2010.  Marland Kitchen. CKD (chronic kidney disease)   . Diabetes mellitus   . Diabetic neuropathy (HCC) 12/23/2011  . Elevated LFTs 12/2011   Bowel hepatitis panel negative  . GERD (gastroesophageal reflux disease)   . Gout   . Hyperlipidemia   . Hypertension   . Hypomagnesemia 12/2011  . Hypothyroidism   . Pneumonia   . Rhabdomyolysis   . Urinary tract infection     Past Surgical History:  Procedure Laterality Date  . CHOLECYSTECTOMY       Inpatient Medications: Scheduled Meds: . amLODipine  10 mg Oral q morning - 10a  . aspirin  325 mg Oral Daily  . calcium-vitamin D  1 tablet Oral q morning - 10a  . colchicine  0.6 mg Oral q morning - 10a  . insulin aspart  0-9 Units Subcutaneous TID WC  . levothyroxine  75 mcg Oral QAC breakfast  . magnesium oxide  200 mg Oral q morning - 10a  . metoprolol tartrate  50 mg Oral BID  . pantoprazole  40 mg Oral Daily   Continuous Infusions: . sodium chloride 50 mL/hr  at 02/10/17 0345  . heparin 12 Units/kg/hr (02/10/17 0206)   PRN Meds: acetaminophen, gi cocktail, nitroGLYCERIN, ondansetron (ZOFRAN) IV  Allergies:    Allergies  Allergen Reactions  . Ace Inhibitors Swelling    Tongue swells   . Benadryl [Diphenhydramine]     Unsure of reaction,   . Cephalexin   . Lasix [Furosemide] Other (See Comments)    Adds Fluid  . Nitrofuran Derivatives Other (See Comments)    Unknown  . Nystatin-Triamcinolone   . Prednisone     Rash   . Sulfa Antibiotics Other (See Comments)    Kidney Disease   . Angiotensin Receptor Blockers Rash    Social History:   Social History   Social History  . Marital status: Widowed    Spouse name: N/A  . Number of children: N/A  . Years of education: N/A   Occupational History  . Not on file.   Social History Main Topics  . Smoking status: Never Smoker  . Smokeless tobacco: Current User    Types: Snuff  . Alcohol use No  . Drug use: No  . Sexual activity: Not on file   Other Topics Concern  . Not on file   Social History Narrative  . No narrative on file    Family History:   The  patient's family history is not on file.  ROS:  Please see the history of present illness.  ROS  All other ROS reviewed and negative.     Physical Exam/Data:   Vitals:   02/10/17 0351 02/10/17 0402 02/10/17 0437 02/10/17 0628  BP: (!) 72/48 94/62 109/62 (!) 103/53  Pulse: 71 69 71 (!) 57  Resp: 16 15 (!) 26 19  Temp:    (!) 97.5 F (36.4 C)  TempSrc:      SpO2: 95% 98% 99% (!) 72%  Weight:      Height:        Intake/Output Summary (Last 24 hours) at 02/10/17 0800 Last data filed at 02/10/17 0503  Gross per 24 hour  Intake          1610.65 ml  Output                0 ml  Net          1610.65 ml   Filed Weights   02/25/17 2043  Weight: 160 lb (72.6 kg)   Body mass index is 29.26 kg/m.  General: Chronically ill elderly white female  HEENT: normal Lymph: no adenopathy Neck: no JVD Endocrine:  No  thryomegaly Vascular: No carotid bruits; FA pulses 2+ bilaterally without bruits  Cardiac:  normal S1, S2; RRR; no murmur   Lungs:  clear to auscultation bilaterally, no wheezing, rhonchi or rales  Abd: soft, nontender, no hepatomegaly  Ext: no edema Musculoskeletal:  No deformities, BUE and BLE strength normal and equal Skin: warm and dry  Neuro:  CNs 2-12 intact, no focal abnormalities noted Psych:  Normal affect   EKG:  The EKG was personally reviewed and demonstrates:  SR nonspecific ST changes lateral ST depression  Telemetry:  Telemetry was personally reviewed and demonstrates:  NSR PAC  Relevant CV Studies: None  Laboratory Data:  Chemistry Recent Labs Lab 02/25/2017 2106 02/10/17 0315  NA 133*  --   K 4.0  --   CL 102  --   CO2 24  --   GLUCOSE 126*  --   BUN 22*  --   CREATININE 1.87* 1.77*  CALCIUM 8.6*  --   GFRNONAA 24* 25*  GFRAA 28* 29*  ANIONGAP 7  --     No results for input(s): PROT, ALBUMIN, AST, ALT, ALKPHOS, BILITOT in the last 168 hours. Hematology Recent Labs Lab 2017/02/25 2106 02/10/17 0315  WBC 7.7 8.2  RBC 4.31 4.06  HGB 12.8 11.7*  HCT 37.4 34.6*  MCV 86.8 85.2  MCH 29.7 28.8  MCHC 34.2 33.8  RDW 15.1 15.0  PLT 134* 96*   Cardiac Enzymes Recent Labs Lab 02/25/17 2106 02/10/17 0315  TROPONINI 0.75* 0.57*   No results for input(s): TROPIPOC in the last 168 hours.  BNP Recent Labs Lab Feb 25, 2017 2106  BNP 952.0*    DDimer No results for input(s): DDIMER in the last 168 hours.  Radiology/Studies:  Dg Chest Portable 1 View  Result Date: 02/10/2017 CLINICAL DATA:  Productive cough and positional chest pain. EXAM: PORTABLE CHEST 1 VIEW COMPARISON:  12/22/2011 FINDINGS: A single AP portable view of the chest demonstrates no focal airspace consolidation or alveolar edema. The lungs are grossly clear. There is no large effusion or pneumothorax. Cardiac and mediastinal contours appear unremarkable. Healed rib fracture deformities on the  right. IMPRESSION: No active disease. Electronically Signed   By: Ellery Plunk M.D.   On: 02/10/2017 02:05    Assessment and Plan:  1. Chest Pain: really more chest wall and epigastric due to GI illness  ECG with nonspecific changes will start with echo and if EF done or RWMA consider further ischemic w/u  D/C heparin Continue beta blocker and ASA  2. Elevated BNP:  No clinical CHF CXR no active disease and not volume overloaded on exam check diastolic parameters on Echo. No diuretic with soft BP 3. GI:  Further w/u per primary service not clear if functional motility disorder or structural consider EGD 4. Azotemia:  Consider hydration per primary service 5. Thyroid:  Needs f/u TSH on replacement   Signed, Charlton HawsPeter Nishan, MD  02/10/2017 8:00 AM

## 2017-02-10 NOTE — ED Provider Notes (Signed)
Called to see patient before transfer to [facility].  C/o intermittent central chest pain and pressure.  Already received ASA.  EKG with worsening inferior and lateral ST depressions. Troponin was 0.75.    CXR was not done.  Breath sounds diminished at bases.  Heparin and NTG gtt started. BNP and CXR ordered.   D/[facility] Santiago Gladarncelli [facility] who is already aware of patient and was updated. Informed of EKG changes and elevated troponin. Repeat troponin pending. Family updated.  CRITICAL CARE Performed by: Glynn OctaveANCOUR, Donica Derouin Total critical care time: 35 minutes Critical care time was exclusive of separately billable procedures and treating other patients. Critical care was necessary to treat or prevent imminent or life-threatening deterioration. Critical care was time spent personally by me on the following activities: development of treatment plan with patient and/or surrogate as well as nursing, discussions with consultants, evaluation of patient's response to treatment, examination of patient, obtaining history from patient or surrogate, ordering and performing treatments and interventions, ordering and review of laboratory studies, ordering and review of radiographic studies, pulse oximetry and re-evaluation of patient's condition.    Glynn Octaveancour, Leeya Rusconi, MD 02/10/17 [phone]

## 2017-02-10 NOTE — Progress Notes (Addendum)
PROGRESS NOTE   Julia Maynard  ZOX:[id number]    DOB: 05/16/1932    DOA: 09-08-16  PCP: Phyllis GingerBaucom, Jenny B, PA-C   I have briefly reviewed patients [facility].  Brief Narrative:  83 year old female, lives with her daughter, up to a week ago was able to ambulate from her bed/chair to the bathroom and in the house,PMH of DM 2 with peripheral neuropathy, GERD, gout, HLD, HTN, hypothyroid, chronic diastolic CHF, initially presented to [facility] for complaints of leg swelling (right >left), intermittent chest pain, cough, was seen by PCP at home and advised to come to the ED. In the ED, ongoing chest pain, elevated troponin, [facility] for cardiology evaluation. Treated with course of antibiotics for suspected URTI on 7/3. Follow-up 7/20 concerning for pneumonia and started on amoxicillin. 6 weeks history of ongoing intermittent nonspecific abdominal pain, diarrhea (between 3-5 episodes of loose/watery stools), intermittent nonbloody emesis? Posttussive, decreased oral intake,? Coughing while eating or drinking. Family denies history of dementia but confused over the last couple of weeks. Initial creatinine 1.87. Cardiology consulting.   Assessment & Plan:   Active Problems:   HTN (hypertension)   Hyperlipidemia   Hypothyroid   GERD (gastroesophageal reflux disease)   Chest pain   1. Chest pain/NSTEMI: Unable to get adequate history from patient due to AMS. Intermittent chest pain for weeks prior to admission. EKG with nonspecific changes. Initial troponin 0.75 > 0.57 > 2.54. Cardiology consultation appreciated. Discussed with Dr. Eden EmmsNishan >starting with 2-D echo and if he is low or wall motion abnormalities then considering further ischemic workup. Heparin discontinued. Continue metoprolol and ASA. No clinical or radiological CHF despite elevated BNP. Not on IV NTG. 2. Chronic nausea, vomiting, diarrhea & abdominal pain: As per history by family.  Unclear etiology. KUB without acute findings. Check GI pathogen panel and C. difficile PCR. Diet as tolerated. Monitor closely. Low index of suspicion for infectious etiology.? Gastroparesis related to DM. 3. Suspected dysphagia: Speech therapy evaluation: 4. Recent pneumonia: Current chest x-ray negative. Pro-calcitonin 1.37. No fever or leukocytosis. Completed 7 days of Augmentin - discontinued. 5. Anemia: No bleeding reported. Follow CBC. 6. Thrombocytopenia: Platelet counts have improved from 96-148. Follow CBC. 7. Acute on stage III chronic kidney disease: Baseline creatinine probably in the 1.3 range. Presented with creatinine of 1.87. Likely secondary to dehydration. Brief IV fluids. Follow BMP in a.m. 8. Essential hypertension: Transient hypotension overnight due to sublingual NTG. Improved. Blood pressures are soft. DC amlodipine. Continue metoprolol with holding parameters. 9. GERD: PPI. 10. Type II DM with peripheral neuropathy: SSI. Reasonable inpatient control. 11. Hypothyroid: Synthroid. Check TSH. 12. Chronic diastolic CHF: Clinically appears dehydrated. Brief IV fluids and follow. 13. Acute encephalopathy: Likely due to acute illness complicating possible underlying dementia. No focal deficits. Treat underlying cause and monitor closely. 14. Right >left leg swelling: Check lower extremity venous Dopplers.   DVT prophylaxis: Subcutaneous heparin Code Status: Full Family Communication: Discussed in detail with patient's daughter and granddaughter at bedside. Disposition: To be determined.   Consultants:  Cardiology   Procedures:  None  Antimicrobials:  Amoxicillin    Subjective: Seen this morning. Patient somnolent, easily arousable, oriented only to self but unable to provide any history. History as above in brief narrative, obtain from family at bedside. 6 weeks of intermittent nausea, vomiting, abdominal pain and diarrhea. No fever or chills. Intermittent coughing but  no sputum.? Coughing while eating or drinking. Weakness and unable to walk  for the last week. No current chest pain. No dyspnea reported.   ROS: Also report right >left leg swelling.  Objective:  Vitals:   02/10/17 0351 02/10/17 0402 02/10/17 0437 02/10/17 0628  BP: (!) 72/48 94/62 109/62 (!) 103/53  Pulse: 71 69 71 (!) 57  Resp: 16 15 (!) 26 19  Temp:    (!) 97.5 F (36.4 C)  TempSrc:      SpO2: 95% 98% 99% (!) 72%  Weight:      Height:        Examination:  General exam: Pleasant elderly female, moderately built, chronically ill-looking, lying comfortably propped up in bed without distress. Respiratory system: Clear to auscultation. Poor inspiratory effort. Cardiovascular system: S1 & S2 heard, RRR. No JVD, murmurs, rubs, gallops or clicks. Trace right leg edema, asymmetric compared to left. Telemetry: Sinus rhythm with occasional PVCs. Gastrointestinal system: Abdomen is nondistended, soft and nontender. No organomegaly or masses felt. Normal bowel sounds heard. Central nervous system: Mental status as above. No focal neurological deficits. Extremities: Spontaneously moves all limbs symmetrically. Skin: No rashes, lesions or ulcers Psychiatry: Judgement and insight impaired. Mood & affect cannot be assessed.    Data Reviewed: I have personally reviewed following labs and imaging studies  CBC:  Recent Labs Lab 10-03-16 2106 02/10/17 0315 02/10/17 0852  WBC 7.7 8.2 8.2  NEUTROABS 4.5  --   --   HGB 12.8 11.7* 10.7*  HCT 37.4 34.6* 31.6*  MCV 86.8 85.2 85.4  PLT 134* 96* 148*   Basic Metabolic Panel:  Recent Labs Lab 10-03-16 2106 02/10/17 0315  NA 133*  --   K 4.0  --   CL 102  --   CO2 24  --   GLUCOSE 126*  --   BUN 22*  --   CREATININE 1.87* 1.77*  CALCIUM 8.6*  --    Liver Function Tests: No results for input(s): AST, ALT, ALKPHOS, BILITOT, PROT, ALBUMIN in the last 168 hours. Coagulation Profile: No results for input(s): INR, PROTIME in the last  168 hours. Cardiac Enzymes:  Recent Labs Lab 10-03-16 2106 02/10/17 0315 02/10/17 0852  TROPONINI 0.75* 0.57* 2.54*   HbA1C: No results for input(s): HGBA1C in the last 72 hours. CBG:  Recent Labs Lab 02/10/17 0312 02/10/17 0813  GLUCAP 125* 105*    No results found for this or any previous visit (from the past 240 hour(s)).       Radiology Studies: Dg Chest Portable 1 View  Result Date: 02/10/2017 CLINICAL DATA:  Productive cough and positional chest pain. EXAM: PORTABLE CHEST 1 VIEW COMPARISON:  12/22/2011 FINDINGS: A single AP portable view of the chest demonstrates no focal airspace consolidation or alveolar edema. The lungs are grossly clear. There is no large effusion or pneumothorax. Cardiac and mediastinal contours appear unremarkable. Healed rib fracture deformities on the right. IMPRESSION: No active disease. Electronically Signed   By: Ellery Plunkaniel R Mitchell M.D.   On: 02/10/2017 02:05   Dg Abd Portable 1v  Result Date: 02/10/2017 CLINICAL DATA:  Nausea vomiting and diarrhea. Rule out fecal impaction. EXAM: PORTABLE ABDOMEN - 1 VIEW COMPARISON:  09/20/2016 FINDINGS: The bowel gas pattern is normal. No radio-opaque calculi or other significant radiographic abnormality are seen. Previous cholecystectomy. IMPRESSION: Negative. Electronically Signed   By: Signa Kellaylor  Stroud M.D.   On: 02/10/2017 09:44        Scheduled Meds: . amLODipine  10 mg Oral q morning - 10a  . aspirin  325 mg Oral Daily  .  calcium-vitamin D  1 tablet Oral q morning - 10a  . colchicine  0.6 mg Oral q morning - 10a  . insulin aspart  0-9 Units Subcutaneous TID WC  . levothyroxine  75 mcg Oral QAC breakfast  . magnesium oxide  200 mg Oral q morning - 10a  . metoprolol tartrate  50 mg Oral BID  . pantoprazole  40 mg Oral Daily   Continuous Infusions: . sodium chloride 75 mL/hr at 02/10/17 0907  . heparin 12 Units/kg/hr (02/10/17 0206)     LOS: 0 days     Aditri Louischarles, MD, FACP,  FHM. [facility] Pager [phone]  If 7PM-7AM, please contact night-coverage [email or web address] Password [facility] 02/10/2017, 11:49 AM

## 2017-02-10 NOTE — ED Notes (Signed)
Pt off unit via [facility] at this time 

## 2017-02-10 NOTE — ED Notes (Signed)
Report given to Caitlyn, RN.

## 2017-02-10 NOTE — Progress Notes (Signed)
Pt arrived from [facility] at 0300 with Heparin gtt running at 700units/hr, with 2 intact PIV and 2L [place]. Admissions and Cards notified of pt's arrival. Pt verbalized she was still having CP, Dr.Carncelli notified and verbalized to give 1 nitro sub lingual. Pt's BP did not tolerate well, MD notified and gave verbal orders for NaCl bolus which did raise pt's BP. Pt still complained of CP at this time, but has never been able to rank or describe pain. Dr. Louretta Parmaarncellie ordered 0.5mg  of Dilaudid and pt is resting comfortably at this time. Pt's VSS and will continue to monitor.

## 2017-02-10 NOTE — ED Notes (Signed)
Report given to Weemsindy, Melburn Hakearelink

## 2017-02-10 NOTE — Evaluation (Signed)
Clinical/Bedside Swallow Evaluation Patient Details  Name: Julia Maynard MRN: [id number] Date of Birth: 1932/07/07  Today's Date: 02/10/2017 Time: SLP Start Time (ACUTE ONLY): 1440 SLP Stop Time (ACUTE ONLY): 1455 SLP Time Calculation (min) (ACUTE ONLY): 15 min  Past Medical History:  Past Medical History:  Diagnosis Date  . Cataract   . CHF (congestive heart failure) (HCC)    EF 70% in 2010.  Marland Kitchen. CKD (chronic kidney disease)   . Diabetes mellitus   . Diabetic neuropathy (HCC) 12/23/2011  . Elevated LFTs 12/2011   Bowel hepatitis panel negative  . GERD (gastroesophageal reflux disease)   . Gout   . Hyperlipidemia   . Hypertension   . Hypomagnesemia 12/2011  . Hypothyroidism   . Pneumonia   . Rhabdomyolysis   . Urinary tract infection    Past Surgical History:  Past Surgical History:  Procedure Laterality Date  . CHOLECYSTECTOMY     HPI:  83 year old female with PMH of DM 2 with peripheral neuropathy, GERD, gout, HLD, HTN, hypothyroid, chronic diastolic CHF, [facility] presented [facility] for complaints of leg swelling (right >left), intermittent chest pain, cough. In the ED, ongoing chest pain, elevated troponin, [facility] for cardiology evaluation. Treated with course of antibiotics for suspected URTI on 7/3. Follow-up 7/20 concerning for pneumonia and started on amoxicillin. 6 weeks history of ongoing intermittent nonspecific abdominal pain, diarrhea (between 3-5 episodes of loose/watery stools), intermittent nonbloody emesis? Posttussive, decreased oral intake,? Coughing while eating or drinking. Family denies history of dementia but confused over the last couple of weeks. CXR 02/10/17 shows no active disease.   Assessment / Plan / Recommendation Clinical Impression  Patient presents with mild-moderate risk for aspiration given her current mentation and history of GERD. She presents today with grossly functional oropharyngeal swallow with appearance of  adequate airway protection at bedside. She is alert, though she keeps her eyes closed for the duration of the evaluation (she also has a visual impairment) and requires occasional cues for attention, alertness. No overt signs of aspiration noted despite challenging with consecutive straw sips of thin liquids in excess of 3oz. Pt is edentulous and requires extended time for mastication. During mastication, required verbal cues for attention and for oral clearance. Mild lingual residue clears with liquid wash. Recommend initiating dys 2 diet with thin liquids, medications whole with liquids, with full supervision for alertness and assistance with feeding as needed. Anticipate advancement of solids as cognition improves. SLP will f/u.  SLP Visit Diagnosis: Dysphagia, unspecified (R13.10)    Aspiration Risk  Mild aspiration risk;Moderate aspiration risk    Diet Recommendation Dysphagia 2 (Fine chop);Thin liquid   Liquid Administration via: Cup;Straw Medication Administration: Whole meds with liquid Supervision: Full supervision/cueing for compensatory strategies Compensations: Small sips/bites;Slow rate;Minimize environmental distractions Postural Changes: Seated upright at 90 degrees;Remain upright for at least 30 minutes after po intake    Other  Recommendations Oral Care Recommendations: Oral care BID   Follow up Recommendations Other (comment) (TBD)      Frequency and Duration min 2x/week  2 weeks       Prognosis Prognosis for Safe Diet Advancement: Good Barriers to Reach Goals: Cognitive deficits      Swallow Study   General Date of Onset: 01/22/2017 HPI: 83 year old female with PMH of DM 2 with peripheral neuropathy, GERD, gout, HLD, HTN, hypothyroid, chronic diastolic CHF, [facility] to [facility] for complaints of leg swelling (right >left), intermittent chest pain, cough. In the ED, ongoing  chest pain, elevated troponin, [facility] for cardiology  evaluation. Treated with course of antibiotics for suspected URTI on 7/3. Follow-up 7/20 concerning for pneumonia and started on amoxicillin. 6 weeks history of ongoing intermittent nonspecific abdominal pain, diarrhea (between 3-5 episodes of loose/watery stools), intermittent nonbloody emesis? Posttussive, decreased oral intake,? Coughing while eating or drinking. Family denies history of dementia but confused over the last couple of weeks. CXR 02/10/17 shows no active disease. Type of Study: Bedside Swallow Evaluation Previous Swallow Assessment: none in chart Diet Prior to this Study: NPO Temperature Spikes Noted: No Respiratory Status: Nasal cannula History of Recent Intubation: No Behavior/Cognition: Cooperative;Confused;Lethargic/Drowsy Oral Cavity Assessment: Within Functional Limits Oral Care Completed by SLP: No Oral Cavity - Dentition: Edentulous Vision: Impaired for self-feeding Self-Feeding Abilities: Needs set up;Needs assist Patient Positioning: Upright in bed Baseline Vocal Quality: Normal Volitional Cough: Strong Volitional Swallow: Able to elicit    Oral/Motor/Sensory Function Overall Oral Motor/Sensory Function: Within functional limits   Ice Chips Ice chips: Within functional limits   Thin Liquid Thin Liquid: Within functional limits Presentation: Self Fed;Cup    Nectar Thick Nectar Thick Liquid: Not tested   Honey Thick Honey Thick Liquid: Not tested   Puree Puree: Within functional limits Presentation: Spoon   Solid   GO   Solid: Impaired Presentation: Self Fed Oral Phase Impairments: Impaired mastication Oral Phase Functional Implications: Impaired mastication;Oral residue    Functional Assessment Tool Used: skilled clinical judgment Functional Limitations: Swallowing Swallow Current Status ([id number]([id number]): At least 1 percent but less than 20 percent impaired, limited or restricted Swallow Goal Status [phone]([id number]): At least 1 percent but less than 20 percent impaired,  limited or restricted   Arlana LindauMary E Meredith Maynard 02/10/2017,5:08 PM  Rondel BatonMary Beth Narely Nobles, MS, CCC-SLP Speech-Language Pathologist [phone]

## 2017-02-10 NOTE — Progress Notes (Signed)
Noted trop continue to go up to > 5, discussed with Dr. Eden EmmsNishan, patient does not have any chest pain, will restart IV heparin. Pending echo before further workup.  Ramond DialSigned, Calene Paradiso PA Pager: [phone]

## 2017-02-10 NOTE — H&P (Addendum)
TRH H&P    Patient Demographics:    Julia Maynard, is a 83 y.o. female  MRN: [id number]  DOB - [birth year]  Admit Date - 01/28/2017  Referring MD/NP/PA: Dr Adriana Simasook  Outpatient Primary MD for the patient is Baucom, Shaune PollackJenny B, PA-C  Patient coming from: Home  Chief Complaint  Patient presents with  . Emesis      HPI:    Julia Maynard  is a 83 y.o. female, With history of CHF, diabetes mellitus, hypertension, hyperlipidemia, hypothyroidism was brought to hospital with chief complaint of worsening leg swelling, intermittent chest pain, coughing up phlegm. Patient was diagnosed with pneumonia on 02/02/2017 and started on amoxicillin. Patient completed the antibiotics but still continued to have swelling and cough so she was brought to hospital. Patient denies chest pain at this time she complains of epigastric pain. Patient does have a history of GERD. She denies shortness of breath. No diaphoresis. No fever or chills. No dysuria urgency or frequency of urination. In the ED, level showed troponin of 0.75. Creatinine was elevated to 1.87. Baseline creatinine of 1.30.  Cardiology [facility] was consulted by ED physician. And cardiology recommended to transfer the patient patient to  [facility].  Patient denies nausea vomiting or diarrhea. No fever. No dysuria.   Review of systems:      All other systems reviewed and are negative.   With Past History of the following :    Past Medical History:  Diagnosis Date  . Cataract   . CHF (congestive heart failure) (HCC)    EF 70% in 2010.  Marland Kitchen. CKD (chronic kidney disease)   . Diabetes mellitus   . Diabetic neuropathy (HCC) 12/23/2011  . Elevated LFTs 12/2011   Bowel hepatitis panel negative  . GERD (gastroesophageal reflux disease)   . Gout   . Hyperlipidemia   . Hypertension   . Hypomagnesemia 12/2011  . Hypothyroidism   . Pneumonia   . Rhabdomyolysis   .  Urinary tract infection       Past Surgical History:  Procedure Laterality Date  . CHOLECYSTECTOMY        Social History:      Social History  Substance Use Topics  . Smoking status: Never Smoker  . Smokeless tobacco: Current User    Types: Snuff  . Alcohol use No       Family History :   Not pertinent   Home Medications:   Prior to Admission medications   Medication Sig Start Date End Date Taking? Authorizing Provider  amLODipine (NORVASC) 10 MG tablet Take 10 mg by mouth every morning.     [provider]  calcium-vitamin D (OSCAL WITH D) 500-200 MG-UNIT per tablet Take 1 tablet by mouth every morning. 12/25/11 03/07/14  Elliot CousinFisher, Denise, MD  cholecalciferol (VITAMIN D) 1000 UNITS tablet Take 1,000 Units by mouth every morning.     [provider]  colchicine 0.6 MG tablet Take 0.6 mg by mouth every morning.     [provider]  fluconazole (DIFLUCAN) 150 MG  tablet Take one tablet today, may repeat in 1 week if needed. 03/07/14   Ivery QualeBryant, Hobson, PA-C  glimepiride (AMARYL) 4 MG tablet Take 1 tablet (4 mg total) by mouth daily with breakfast. HOLD FOR CAPILLARY BLOOD GLUCOSE OF LESS THAN 120. 12/25/11 03/07/14  Elliot CousinFisher, Denise, MD  iron polysaccharides (POLY-IRON 150) 150 MG capsule Take 150 mg by mouth every morning.     [provider]  LACTOBACILLUS PO Take 1 tablet by mouth every morning.     [provider]  levothyroxine (SYNTHROID, LEVOTHROID) 75 MCG tablet Take 75 mcg by mouth daily.    [provider]  magnesium 30 MG tablet Take 30 mg by mouth every morning.    [provider]  metoprolol (LOPRESSOR) 50 MG tablet Take 50 mg by mouth 2 (two) times daily.    [provider]  nystatin (MYCOSTATIN/NYSTOP) 100000 UNIT/GM POWD Apply to rash 2 or 3 times daily. 03/07/14   Ivery QualeBryant, Hobson, PA-C  omeprazole (PRILOSEC) 20 MG capsule Take 20 mg by mouth every morning. 12/25/11 03/07/14  Elliot CousinFisher, Denise, MD      Allergies:     Allergies  Allergen Reactions  . Ace Inhibitors Swelling    Tongue swells   . Benadryl [Diphenhydramine]     Unsure of reaction,   . Cephalexin   . Lasix [Furosemide] Other (See Comments)    Adds Fluid  . Nitrofuran Derivatives Other (See Comments)    Unknown  . Nystatin-Triamcinolone   . Prednisone     Rash   . Sulfa Antibiotics Other (See Comments)    Kidney Disease   . Angiotensin Receptor Blockers Rash     Physical Exam:   Vitals  Blood pressure 122/68, pulse 70, temperature 98 F (36.7 C), temperature source Oral, resp. rate 13, height 5\' 2"  (1.575 m), weight 72.6 kg (160 lb), SpO2 99 %.  1.  General: Appears in no acute distress  2. Psychiatric:  Intact judgement and  insight, awake alert, oriented x 3.  3. Neurologic: No focal neurological deficits, all cranial nerves intact.Strength 5/5 all 4 extremities, sensation intact all 4 extremities, plantars down going.  4. Eyes :  anicteric sclerae, moist conjunctivae with no lid lag. PERRLA.  5. ENMT:  Oropharynx clear with moist mucous membranes and good dentition  6. Neck:  supple, no cervical lymphadenopathy appriciated, No thyromegaly  7. Respiratory : Normal respiratory effort, good air movement bilaterally,clear to  auscultation bilaterally  8. Cardiovascular : RRR, no gallops, rubs or murmurs, bilateral 1+ pitting edema  9. Gastrointestinal:  Positive bowel sounds, abdomen soft, non-tender to palpation,no hepatosplenomegaly, no rigidity or guarding       10. Skin:  No cyanosis, normal texture and turgor, no rash, lesions or ulcers  11.Musculoskeletal:  Good muscle tone,  joints appear normal , no effusions,  normal range of motion    Data Review:    CBC  Recent Labs Lab 2017-02-06 2106  WBC 7.7  HGB 12.8  HCT 37.4  PLT 134*  MCV 86.8  MCH 29.7  MCHC 34.2  RDW 15.1  LYMPHSABS 2.3  MONOABS 0.8  EOSABS 0.1  BASOSABS 0.0    ------------------------------------------------------------------------------------------------------------------  Chemistries   Recent Labs Lab 2017-02-06 2106  NA 133*  K 4.0  CL 102  CO2 24  GLUCOSE 126*  BUN 22*  CREATININE 1.87*  CALCIUM 8.6*   ------------------------------------------------------------------------------------------------------------------  ------------------------------------------------------------------------------------------------------------------ GFR: Estimated Creatinine Clearance: 21.3 mL/min (A) (by C-G formula based on SCr of 1.87 mg/dL (H)). Liver  Function Tests: No results for input(s): AST, ALT, ALKPHOS, BILITOT, PROT, ALBUMIN in the last 168 hours. No results for input(s): LIPASE, AMYLASE in the last 168 hours. No results for input(s): AMMONIA in the last 168 hours. Coagulation Profile: No results for input(s): INR, PROTIME in the last 168 hours. Cardiac Enzymes:  Recent Labs Lab Feb 14, 2017 2106  TROPONINI 0.75*   BNP (last 3 results) No results for input(s): PROBNP in the last 8760 hours. HbA1C: No results for input(s): HGBA1C in the last 72 hours. CBG: No results for input(s): GLUCAP in the last 168 hours. Lipid Profile: No results for input(s): CHOL, HDL, LDLCALC, TRIG, CHOLHDL, LDLDIRECT in the last 72 hours. Thyroid Function Tests: No results for input(s): TSH, T4TOTAL, FREET4, T3FREE, THYROIDAB in the last 72 hours. Anemia Panel: No results for input(s): VITAMINB12, FOLATE, FERRITIN, TIBC, IRON, RETICCTPCT in the last 72 hours.  ---------------------------------------------------------------------------------------------------------------      Imaging Results:    No results found.  My personal review of EKG: Rhythm NSR   Assessment & Plan:    Active Problems:   HTN (hypertension)   Hyperlipidemia   Hypothyroid   GERD (gastroesophageal reflux disease)   Chest pain   1. Chest pain-resolved at this time,  place under observation, Initial troponin was 0.75, obtain serial cardiac enzymes. Patient to transfer to [facility]. Cardiology to see patient when patient arrives at [facility] 2. GERD- continue PPIs daily 3. Diabetes mellitus-hold glimepiride, start sliding scale insulin with NovoLog 4. Hypothyroidism-continue Synthroid 5. Acute on chronic kidney disease stage III-patient's creatinine 1.87, baseline creatinine around 1.30. Started on gentle IV hydration with normal saline. Follow BMP in a.m. 6. Hypertension-blood pressure stable: Continue metoprolol 50 mg twice a day  Patient to transfer to [facility], Dr. Toniann Fail is the accepting physician  DVT Prophylaxis-   Lovenox   AM Labs Ordered, also please review Full Orders  Family Communication: Admission, patients condition and plan of care including tests being ordered have been discussed with the patient and Her family at bedside who indicate understanding and agree with the plan and Code Status.  Code Status:  Full code  Admission status: Observation    Time spent in minutes : 60 minutes   Selassie Spatafore S M.D on 02/10/2017 at 1:02 AM  Between 7am to 7pm - Pager - [phone]. After 7pm go to [email or web address] - password [facility]  [facility] - Office  [phone]

## 2017-02-10 NOTE — Progress Notes (Signed)
ANTICOAGULATION CONSULT NOTE - Preliminary  Pharmacy Consult for heparin Indication: Suspected MI  Allergies  Allergen Reactions  . Ace Inhibitors Swelling    Tongue swells   . Benadryl [Diphenhydramine]     Unsure of reaction,   . Cephalexin   . Lasix [Furosemide] Other (See Comments)    Adds Fluid  . Nitrofuran Derivatives Other (See Comments)    Unknown  . Nystatin-Triamcinolone   . Prednisone     Rash   . Sulfa Antibiotics Other (See Comments)    Kidney Disease   . Angiotensin Receptor Blockers Rash    Patient Measurements: Height: 5\' 2"  (157.5 cm) Weight: 160 lb (72.6 kg) IBW/kg (Calculated) : 50.1 DW: 59.1kg   Vital Signs: Temp: 98 F (36.7 C) (07/27 2042) Temp Source: Oral (07/27 2042) BP: 126/95 (07/28 0130) Pulse Rate: 70 (07/28 0030)  Labs:  Recent Labs  11-12-2016 2106  HGB 12.8  HCT 37.4  PLT 134*  CREATININE 1.87*  TROPONINI 0.75*   Estimated Creatinine Clearance: 21.3 mL/min (A) (by C-G formula based on SCr of 1.87 mg/dL (H)).  Medical History: Past Medical History:  Diagnosis Date  . Cataract   . CHF (congestive heart failure) (HCC)    EF 70% in 2010.  Marland Kitchen. CKD (chronic kidney disease)   . Diabetes mellitus   . Diabetic neuropathy (HCC) 12/23/2011  . Elevated LFTs 12/2011   Bowel hepatitis panel negative  . GERD (gastroesophageal reflux disease)   . Gout   . Hyperlipidemia   . Hypertension   . Hypomagnesemia 12/2011  . Hypothyroidism   . Pneumonia   . Rhabdomyolysis   . Urinary tract infection     Medications: pertinent home meds Amlodipine 10mg  daily Metoprolol 50mg  daily Prilosec 20mg  daily   Assessment: 83 yo with intermittent chest pain and pressure with elevated troponin 0.75 and EKG changes.  Received ASA and [facility]    Goal of Therapy:  Heparin level 0.3-0.7    Plan:  Heparin bolus of 3000 units followed by a continuous infusion of 12 units/kg/hr.  Will check heparin level 6 hours after infusion  begins.   Preliminary review of pertinent patient information completed.    Hurley CiscoMendenhall, Shanika Levings D, [facility] LPRPH 02/10/2017,2:06 AM

## 2017-02-10 NOTE — Progress Notes (Signed)
ANTICOAGULATION CONSULT NOTE - Initial Consult  Pharmacy Consult for heparin Indication: chest pain/ACS  Allergies  Allergen Reactions  . Ace Inhibitors Swelling    Tongue swells   . Benadryl [Diphenhydramine]     Unsure of reaction,   . Cephalexin   . Lasix [Furosemide] Other (See Comments)    Adds Fluid  . Nitrofuran Derivatives Other (See Comments)    Unknown  . Nystatin-Triamcinolone   . Prednisone     Rash   . Sulfa Antibiotics Other (See Comments)    Kidney Disease   . Angiotensin Receptor Blockers Rash    Patient Measurements: Height: 5\' 2"  (157.5 cm) Weight: 160 lb (72.6 kg) IBW/kg (Calculated) : 50.1 Heparin Dosing Weight: 65.6 kg  Vital Signs: Temp: 97.3 F (36.3 C) (07/28 1645) Temp Source: Oral (07/28 1645) BP: 111/76 (07/28 1645) Pulse Rate: 61 (07/28 1645)  Labs:  Recent Labs  01/24/2017 2106 02/10/17 0315 02/10/17 0852 02/10/17 1512  HGB 12.8 11.7* 10.7*  --   HCT 37.4 34.6* 31.6*  --   PLT 134* 96* 148*  --   HEPARINUNFRC  --   --  0.40  --   CREATININE 1.87* 1.77*  --   --   TROPONINI 0.75* 0.57* 2.54* 5.44*    Estimated Creatinine Clearance: 22.5 mL/min (A) (by C-G formula based on SCr of 1.77 mg/dL (H)).   Medical History: Past Medical History:  Diagnosis Date  . Cataract   . CHF (congestive heart failure) (HCC)    EF 70% in 2010.  Marland Kitchen. CKD (chronic kidney disease)   . Diabetes mellitus   . Diabetic neuropathy (HCC) 12/23/2011  . Elevated LFTs 12/2011   Bowel hepatitis panel negative  . GERD (gastroesophageal reflux disease)   . Gout   . Hyperlipidemia   . Hypertension   . Hypomagnesemia 12/2011  . Hypothyroidism   . Pneumonia   . Rhabdomyolysis   . Urinary tract infection     Medications:  Scheduled:  . aspirin  325 mg Oral Daily  . calcium-vitamin D  1 tablet Oral q morning - 10a  . colchicine  0.6 mg Oral q morning - 10a  . insulin aspart  0-9 Units Subcutaneous TID WC  . levothyroxine  75 mcg Oral QAC breakfast  .  magnesium oxide  200 mg Oral q morning - 10a  . metoprolol tartrate  50 mg Oral BID  . pantoprazole  40 mg Oral Daily   Infusions:  . sodium chloride 75 mL/hr at 02/10/17 0907  . heparin      Assessment: 83 yo female to restart heparin IV for ACS/STEMI. Patient received initial heparin bolus and infusion early this morning (~0200) and heparin level was therapeutic. Heparin was then discontinued as chest pain was thought to be epigastric in origin. Cardiology would like to restart now due to up-trending troponin. Of note, patient received last dose of SQ heparin at 1400, therefore will not re-bolus. HGB trending down, PLT low but stable. No bleeding noted.  Goal of Therapy:  Heparin level 0.3-0.7 units/ml Monitor platelets by anticoagulation protocol: Yes   Plan:  Start heparin infusion at 700 units/hr (~12 units/kg/hr) Check anti-Xa level in 8 hours and daily while on heparin Continue to monitor H&H and platelets   Al CorpusLindsey Alferd Obryant, PharmD PGY1 [facility] Resident Pager: [phone] After 4:30PM please call Main Pharmacy [phone] 02/10/2017,6:29 PM

## 2017-02-11 ENCOUNTER — Observation Stay (HOSPITAL_BASED_OUTPATIENT_CLINIC_OR_DEPARTMENT_OTHER): Payer: Medicare Other

## 2017-02-11 ENCOUNTER — Observation Stay (HOSPITAL_COMMUNITY): Payer: Medicare Other

## 2017-02-11 DIAGNOSIS — D631 Anemia in chronic kidney disease: Secondary | ICD-10-CM | POA: Diagnosis present

## 2017-02-11 DIAGNOSIS — I361 Nonrheumatic tricuspid (valve) insufficiency: Secondary | ICD-10-CM | POA: Diagnosis not present

## 2017-02-11 DIAGNOSIS — K3184 Gastroparesis: Secondary | ICD-10-CM | POA: Diagnosis present

## 2017-02-11 DIAGNOSIS — M7989 Other specified soft tissue disorders: Secondary | ICD-10-CM

## 2017-02-11 DIAGNOSIS — F17290 Nicotine dependence, other tobacco product, uncomplicated: Secondary | ICD-10-CM | POA: Diagnosis present

## 2017-02-11 DIAGNOSIS — G9340 Encephalopathy, unspecified: Secondary | ICD-10-CM | POA: Diagnosis present

## 2017-02-11 DIAGNOSIS — E785 Hyperlipidemia, unspecified: Secondary | ICD-10-CM | POA: Diagnosis present

## 2017-02-11 DIAGNOSIS — E039 Hypothyroidism, unspecified: Secondary | ICD-10-CM | POA: Diagnosis present

## 2017-02-11 DIAGNOSIS — E1142 Type 2 diabetes mellitus with diabetic polyneuropathy: Secondary | ICD-10-CM | POA: Diagnosis present

## 2017-02-11 DIAGNOSIS — I214 Non-ST elevation (NSTEMI) myocardial infarction: Principal | ICD-10-CM

## 2017-02-11 DIAGNOSIS — R079 Chest pain, unspecified: Secondary | ICD-10-CM | POA: Diagnosis not present

## 2017-02-11 DIAGNOSIS — E11649 Type 2 diabetes mellitus with hypoglycemia without coma: Secondary | ICD-10-CM | POA: Diagnosis not present

## 2017-02-11 DIAGNOSIS — R0902 Hypoxemia: Secondary | ICD-10-CM | POA: Diagnosis not present

## 2017-02-11 DIAGNOSIS — J189 Pneumonia, unspecified organism: Secondary | ICD-10-CM | POA: Diagnosis present

## 2017-02-11 DIAGNOSIS — N183 Chronic kidney disease, stage 3 (moderate): Secondary | ICD-10-CM | POA: Diagnosis present

## 2017-02-11 DIAGNOSIS — E860 Dehydration: Secondary | ICD-10-CM | POA: Diagnosis present

## 2017-02-11 DIAGNOSIS — I5032 Chronic diastolic (congestive) heart failure: Secondary | ICD-10-CM | POA: Diagnosis present

## 2017-02-11 DIAGNOSIS — E1143 Type 2 diabetes mellitus with diabetic autonomic (poly)neuropathy: Secondary | ICD-10-CM | POA: Diagnosis present

## 2017-02-11 DIAGNOSIS — I420 Dilated cardiomyopathy: Secondary | ICD-10-CM | POA: Diagnosis present

## 2017-02-11 DIAGNOSIS — R748 Abnormal levels of other serum enzymes: Secondary | ICD-10-CM | POA: Diagnosis present

## 2017-02-11 DIAGNOSIS — I130 Hypertensive heart and chronic kidney disease with heart failure and stage 1 through stage 4 chronic kidney disease, or unspecified chronic kidney disease: Secondary | ICD-10-CM | POA: Diagnosis present

## 2017-02-11 DIAGNOSIS — N179 Acute kidney failure, unspecified: Secondary | ICD-10-CM | POA: Diagnosis present

## 2017-02-11 DIAGNOSIS — K219 Gastro-esophageal reflux disease without esophagitis: Secondary | ICD-10-CM | POA: Diagnosis present

## 2017-02-11 DIAGNOSIS — E1122 Type 2 diabetes mellitus with diabetic chronic kidney disease: Secondary | ICD-10-CM | POA: Diagnosis present

## 2017-02-11 DIAGNOSIS — R570 Cardiogenic shock: Secondary | ICD-10-CM | POA: Diagnosis not present

## 2017-02-11 DIAGNOSIS — D696 Thrombocytopenia, unspecified: Secondary | ICD-10-CM | POA: Diagnosis present

## 2017-02-11 DIAGNOSIS — Z515 Encounter for palliative care: Secondary | ICD-10-CM | POA: Diagnosis present

## 2017-02-11 LAB — BASIC METABOLIC PANEL
ANION GAP: 8 (ref 5–15)
BUN: 21 mg/dL — ABNORMAL HIGH (ref 6–20)
CALCIUM: 8.2 mg/dL — AB (ref 8.9–10.3)
CO2: 19 mmol/L — AB (ref 22–32)
Chloride: 106 mmol/L (ref 101–111)
Creatinine, Ser: 1.76 mg/dL — ABNORMAL HIGH (ref 0.44–1.00)
GFR, EST AFRICAN AMERICAN: 30 mL/min — AB (ref >60)
GFR, EST NON AFRICAN AMERICAN: 26 mL/min — AB (ref >60)
GLUCOSE: 57 mg/dL — AB (ref 65–99)
POTASSIUM: 4.4 mmol/L (ref 3.5–5.1)
Sodium: 133 mmol/L — ABNORMAL LOW (ref 135–145)

## 2017-02-11 LAB — ECHOCARDIOGRAM COMPLETE
Height: 62 in
Weight: 2702.4 oz

## 2017-02-11 LAB — GLUCOSE, CAPILLARY
GLUCOSE-CAPILLARY: 46 mg/dL — AB (ref 65–99)
GLUCOSE-CAPILLARY: 69 mg/dL (ref 65–99)
GLUCOSE-CAPILLARY: 90 mg/dL (ref 65–99)
GLUCOSE-CAPILLARY: 81 mg/dL (ref 65–99)
GLUCOSE-CAPILLARY: 84 mg/dL (ref 65–99)
Glucose-Capillary: 76 mg/dL (ref 65–99)

## 2017-02-11 LAB — CBC
HEMATOCRIT: 34 % — AB (ref 36.0–46.0)
HEMOGLOBIN: 11.2 g/dL — AB (ref 12.0–15.0)
MCH: 28.8 pg (ref 26.0–34.0)
MCHC: 32.9 g/dL (ref 30.0–36.0)
MCV: 87.4 fL (ref 78.0–100.0)
Platelets: 125 10*3/uL — ABNORMAL LOW (ref 150–400)
RBC: 3.89 MIL/uL (ref 3.87–5.11)
RDW: 15.8 % — ABNORMAL HIGH (ref 11.5–15.5)
WBC: 9.6 10*3/uL (ref 4.0–10.5)

## 2017-02-11 LAB — HEPARIN LEVEL (UNFRACTIONATED)
HEPARIN UNFRACTIONATED: 0.59 [IU]/mL (ref 0.30–0.70)
Heparin Unfractionated: 0.51 IU/mL (ref 0.30–0.70)

## 2017-02-11 LAB — HEMOGLOBIN A1C
Hgb A1c MFr Bld: 5.7 % — ABNORMAL HIGH (ref 4.8–5.6)
Mean Plasma Glucose: 117 mg/dL

## 2017-02-11 MED ORDER — SODIUM CHLORIDE 0.9% FLUSH: 3.0000 mL | INTRAVENOUS | Status: DC | PRN

## 2017-02-11 MED ORDER — OXYCODONE-ACETAMINOPHEN 5-325 MG PO TABS: 2.0000 | ORAL_TABLET | Freq: Once | ORAL | Status: DC

## 2017-02-11 MED ORDER — SODIUM CHLORIDE 0.9 % IV BOLUS (SEPSIS)
500.0000 mL | Freq: Once | INTRAVENOUS | Status: AC
  Administered 2017-02-11: 500 mL via INTRAVENOUS

## 2017-02-11 MED ORDER — ASPIRIN 81 MG PO CHEW: 81.0000 mg | CHEWABLE_TABLET | ORAL | Status: DC

## 2017-02-11 MED ORDER — PANTOPRAZOLE SODIUM 40 MG IV SOLR
40.0000 mg | INTRAVENOUS | Status: DC
  Administered 2017-02-11: 40 mg via INTRAVENOUS

## 2017-02-11 MED ORDER — METOPROLOL TARTRATE 12.5 MG HALF TABLET
12.5000 mg | ORAL_TABLET | Freq: Two times a day (BID) | ORAL | Status: DC
  Filled 2017-02-11 (×2): qty 1

## 2017-02-11 MED ORDER — SODIUM CHLORIDE 0.9% FLUSH: 3.0000 mL | Freq: Two times a day (BID) | INTRAVENOUS | Status: DC

## 2017-02-11 MED ORDER — ISOSORBIDE DINITRATE 10 MG PO TABS
10.0000 mg | ORAL_TABLET | Freq: Three times a day (TID) | ORAL | Status: DC
  Filled 2017-02-11 (×2): qty 1

## 2017-02-11 MED ORDER — SODIUM CHLORIDE 0.9 % IV SOLN: INTRAVENOUS | Status: DC

## 2017-02-11 MED ORDER — SODIUM CHLORIDE 0.9 % IV SOLN: 250.0000 mL | INTRAVENOUS | Status: DC | PRN

## 2017-02-11 MED ORDER — TORSEMIDE 20 MG PO TABS
10.0000 mg | ORAL_TABLET | Freq: Every day | ORAL | Status: DC
  Administered 2017-02-11: 10 mg via ORAL
  Filled 2017-02-11: qty 1

## 2017-02-11 MED ORDER — BENZONATATE 100 MG PO CAPS
200.0000 mg | ORAL_CAPSULE | Freq: Three times a day (TID) | ORAL | Status: DC | PRN
  Filled 2017-02-11: qty 2

## 2017-02-11 MED ORDER — ALBUTEROL SULFATE (2.5 MG/3ML) 0.083% IN NEBU: 2.5000 mg | INHALATION_SOLUTION | RESPIRATORY_TRACT | Status: DC | PRN

## 2017-02-11 MED ORDER — HYDRALAZINE HCL 10 MG PO TABS
10.0000 mg | ORAL_TABLET | Freq: Three times a day (TID) | ORAL | Status: DC
  Filled 2017-02-11: qty 1

## 2017-02-11 NOTE — Progress Notes (Signed)
ANTICOAGULATION CONSULT NOTE - Follow Up Consult  Pharmacy Consult for heparin Indication: chest pain/ACS  Allergies  Allergen Reactions  . Ace Inhibitors Swelling    Tongue swells   . Benadryl [Diphenhydramine]     Unsure of reaction,   . Cephalexin   . Lasix [Furosemide] Other (See Comments)    Adds Fluid  . Nitrofuran Derivatives Other (See Comments)    Unknown  . Nystatin-Triamcinolone   . Prednisone     Rash   . Sulfa Antibiotics Other (See Comments)    Kidney Disease   . Angiotensin Receptor Blockers Rash    Patient Measurements: Height: 5\' 2"  (157.5 cm) Weight: 168 lb 14.4 oz (76.6 kg) IBW/kg (Calculated) : 50.1   Vital Signs: Temp: 98.5 F (36.9 C) (07/29 0358) BP: 99/66 (07/29 0944) Pulse Rate: 58 (07/29 0944)  Labs:  Recent Labs  08-24-2016 2106 02/10/17 0315 02/10/17 0852 02/10/17 1512 02/11/17 0439 02/11/17 0959  HGB 12.8 11.7* 10.7*  --  11.2*  --   HCT 37.4 34.6* 31.6*  --  34.0*  --   PLT 134* 96* 148*  --  125*  --   HEPARINUNFRC  --   --  0.40  --  0.51 0.59  CREATININE 1.87* 1.77*  --   --  1.76*  --   TROPONINI 0.75* 0.57* 2.54* 5.44*  --   --     Estimated Creatinine Clearance: 23.2 mL/min (A) (by C-G formula based on SCr of 1.76 mg/dL (H)).   Medications:  Scheduled:  . aspirin  325 mg Oral Daily  . calcium-vitamin D  1 tablet Oral q morning - 10a  . colchicine  0.6 mg Oral q morning - 10a  . hydrALAZINE  10 mg Oral Q8H  . isosorbide dinitrate  10 mg Oral TID  . levothyroxine  75 mcg Oral QAC breakfast  . magnesium oxide  200 mg Oral q morning - 10a  . metoprolol tartrate  12.5 mg Oral BID  . pantoprazole (PROTONIX) IV  40 mg Intravenous Q24H  . torsemide  10 mg Oral Daily    Assessment: 83yo female with chest pain, on heparin.  CBC low but stable with no bleeding reported.  Heparin gtt previously d/c, now restarted with increasing troponin per cards and therapeutic x 2 levels today.  Heparin level therapeutic: 0.59 on 700  units/hr  Goal of Therapy:  Heparin level 0.3-0.7 units/ml Monitor platelets by anticoagulation protocol: Yes   Plan:  Continue heparin gtt at 700 units/hr Monitor daily CBC and heparin level Watch for s/s of bleeding  Daylene PoseyJonathan Rasheda Ledger, PharmD Pharmacy Resident Pager #: [phone] 02/11/2017 11:41 AM

## 2017-02-11 NOTE — Progress Notes (Signed)
Came to bedside due to hypotension below baseline. Patient received 2mg  morphine earlier tonight. Since that time her pressures have been trending downward, now in the 60's/40's. On exam, she is easily arousable and interactive. She is oriented to person and place. She has no acute complaints and denies dizziness, lightheadedness, visual disturbance, neurologic changes chest discomfort, dyspnea. She is able to lay at 10 degrees in bed with 1 pillow. Her JVP is very low, < 3 cm H2O despite having recently received 500cc NS.   I spoke with the patient and family about our reason for concern. They are all understanding of the patient's situation. I explained that her hypotension is likely a combination of medication side effect and dehydration. She will receive another 500cc NS bolus (1L total) and we will continue to monitor her blood pressure and for symptoms. If her pressure does not improve with the additional fluid, she will be started on an IV vasopressor (either dopamine or norepinephrine).   Notably the patient is currently listed as full code. Given her age, comorbidities, and current clinical status, this should be addressed with the family within the next 24 hours. If she remains hypotensive, I will discuss goals of care and code status with the family at the bedside.   Fatima BlankAnthony Tabita Corbo, MD Cardiology Fellow

## 2017-02-11 NOTE — Progress Notes (Signed)
Progress Note  Patient Name: Julia Maynard Date of EnReather Laurencecounter: 02/11/2017  Primary Cardiologist: Eden EmmsNishan  Subjective   NO chest pain or dyspnea Bedside echo being done  Inpatient Medications    Scheduled Meds: . aspirin  325 mg Oral Daily  . calcium-vitamin D  1 tablet Oral q morning - 10a  . colchicine  0.6 mg Oral q morning - 10a  . hydrALAZINE  10 mg Oral Q8H  . isosorbide dinitrate  10 mg Oral TID  . levothyroxine  75 mcg Oral QAC breakfast  . magnesium oxide  200 mg Oral q morning - 10a  . metoprolol tartrate  50 mg Oral BID  . pantoprazole  40 mg Oral Daily  . torsemide  10 mg Oral Daily   Continuous Infusions: . heparin 700 Units/hr (02/10/17 1844)   PRN Meds: acetaminophen, morphine injection, nitroGLYCERIN, ondansetron (ZOFRAN) IV   Vital Signs    Vitals:   02/10/17 2255 02/11/17 0029 02/11/17 0145 02/11/17 0358  BP: 118/85 112/67 127/79 106/68  Pulse: 66 63 63   Resp: 17 (!) 21 18 18   Temp:  98.6 F (37 C)  98.5 F (36.9 C)  TempSrc:      SpO2: 95% 95% 94% 93%  Weight:    168 lb 14.4 oz (76.6 kg)  Height:        Intake/Output Summary (Last 24 hours) at 02/11/17 0840 Last data filed at 02/11/17 0630  Gross per 24 hour  Intake          1549.12 ml  Output              200 ml  Net          1349.12 ml   Filed Weights   05/17/2017 2043 02/11/17 0358  Weight: 160 lb (72.6 kg) 168 lb 14.4 oz (76.6 kg)    Telemetry    NSR 02/11/2017  - Personally Reviewed  ECG    SR poor R wave progression PVC  - Personally Reviewed  Physical Exam  Obese white female  GEN: No acute distress.   Neck: No JVD Cardiac: RRR, MR  murmurs, rubs, or gallops.  Respiratory: Clear to auscultation bilaterally. GI: Soft, nontender, non-distended  MS: No edema; No deformity. Neuro:  Nonfocal  Psych: Normal affect   Labs    Chemistry Recent Labs Lab 05/17/2017 2106 02/10/17 0315 02/11/17 0439  NA 133*  --  133*  K 4.0  --  4.4  CL 102  --  106  CO2 24  --  19*    GLUCOSE 126*  --  57*  BUN 22*  --  21*  CREATININE 1.87* 1.77* 1.76*  CALCIUM 8.6*  --  8.2*  GFRNONAA 24* 25* 26*  GFRAA 28* 29* 30*  ANIONGAP 7  --  8     Hematology Recent Labs Lab 02/10/17 0315 02/10/17 0852 02/11/17 0439  WBC 8.2 8.2 9.6  RBC 4.06 3.70* 3.89  HGB 11.7* 10.7* 11.2*  HCT 34.6* 31.6* 34.0*  MCV 85.2 85.4 87.4  MCH 28.8 28.9 28.8  MCHC 33.8 33.9 32.9  RDW 15.0 14.9 15.8*  PLT 96* 148* 125*    Cardiac Enzymes Recent Labs Lab 05/17/2017 2106 02/10/17 0315 02/10/17 0852 02/10/17 1512  TROPONINI 0.75* 0.57* 2.54* 5.44*   No results for input(s): TROPIPOC in the last 168 hours.   BNP Recent Labs Lab 05/17/2017 2106  BNP 952.0*     DDimer No results for input(s): DDIMER in the last 168 hours.  Radiology    Dg Chest Portable 1 View  Result Date: 02/10/2017 CLINICAL DATA:  Productive cough and positional chest pain. EXAM: PORTABLE CHEST 1 VIEW COMPARISON:  12/22/2011 FINDINGS: A single AP portable view of the chest demonstrates no focal airspace consolidation or alveolar edema. The lungs are grossly clear. There is no large effusion or pneumothorax. Cardiac and mediastinal contours appear unremarkable. Healed rib fracture deformities on the right. IMPRESSION: No active disease. Electronically Signed   By: Ellery Plunkaniel R Mitchell M.D.   On: 02/10/2017 02:05   Dg Abd Portable 1v  Result Date: 02/10/2017 CLINICAL DATA:  Nausea vomiting and diarrhea. Rule out fecal impaction. EXAM: PORTABLE ABDOMEN - 1 VIEW COMPARISON:  09/20/2016 FINDINGS: The bowel gas pattern is normal. No radio-opaque calculi or other significant radiographic abnormality are seen. Previous cholecystectomy. IMPRESSION: Negative. Electronically Signed   By: Signa Kellaylor  Stroud M.D.   On: 02/10/2017 09:44    Cardiac Studies   Echo :  EF 25-30% moderate MR ? takatsubo with apical , septal mid and distal anterior and inferior wall  hypokinesis  Patient Profile     83 y.o. female with prolonged  GI illness recent pneumonia atypical chest pain SEMI with troponin up to  5.4 no acute ST changes Echo with EF 25-30%  Assessment & Plan    1) SEMI:  Discussed with family and patient favor diagnostic cath in am risks discussed willing to  Proceed Continue heparin and ASA orders written put on board for Dr Herbie BaltimoreHarding 2) DCM:  EF 25-30% start nitrates/hydralazine has allergy to ACE/Lasix start low dose torsemide   Signed, Charlton HawsPeter Julyan Gales, MD  02/11/2017, 8:40 AM

## 2017-02-11 NOTE — Progress Notes (Signed)
PT Cancellation Note  Patient Details Name: Reather Laurencella Ballew MRN: [id number] DOB: 1931-09-18   Cancelled Treatment:    Reason Eval/Treat Not Completed: Patient at procedure or test/unavailable;Medical issues which prohibited therapy.  Patient leaving for doppler study to r/o DVT.  Patient also with troponin elevating, and to have cardiac cath possibly tomorrow.  Will return at later time for PT evaluation.   Vena AustriaSusan H Jourdyn Ferrin 02/11/2017, 10:17 PM Durenda HurtSusan H. Renaldo Fiddleravis, PT, [facility] Acute Rehab Services Pager [phone]

## 2017-02-11 NOTE — Significant Event (Addendum)
Rapid Response Event Note  Overview:Called by bedside RN d/t patient BP-60s after giving morphine for chest pain Time Called: 2200 Arrival Time: 2205 Event Type: Cardiac, Hypotension  Initial Focused Assessment: Pt laying in bed, alert X2, c/o no chest pain, no SOB.  BP now 75/49.  EKG not obtained (patient with multiple instances of CP since admission.).  EKG SR-60s with no changes.  Interventions: Tama GanderKatherine Schorr notified PTA RRT and ordered 500cc NS bolus over 2 hours.  Plan of Care (if not transferred): Monitor pt.  Call RRT if further assistance needed. Event Summary: Name of Physician Notified: Schorr, NP  (notified PTA RRT) at 2200    at    Outcome: Stayed in room and stabalized  Event End Time: 2215  Terrilyn SaverHopper, Kimila Papaleo Anderson

## 2017-02-11 NOTE — Plan of Care (Signed)
Problem: Safety: Goal: Ability to remain free from injury will improve Outcome: Progressing Pt will remain in low bed with bed alarm on. Pt's family have remained at bedside.   Problem: Pain Managment: Goal: General experience of comfort will improve Outcome: Progressing Pt still complaining intermittent substernal CP that is relieved with Morphine. MD kept aware and will continue to administer PRN pain medication per MD orders.

## 2017-02-11 NOTE — Progress Notes (Signed)
Pt began complaining of CP. EKG completed and Dr. Antionette Charpyd notified. MD ordered Morphine PRN for pain. Pt's VSS and will continue to monitor.

## 2017-02-11 NOTE — Progress Notes (Signed)
VASCULAR LAB PRELIMINARY  PRELIMINARY  PRELIMINARY  PRELIMINARY  Bilateral lower extremity venous duplex completed.    Preliminary report:  There is no obvious evidence of DVT or SVT noted in the visualized veins of the bilateral lower extremities.   Harman Ferrin, RVT 02/11/2017, 4:58 PM

## 2017-02-11 NOTE — Progress Notes (Signed)
ANTICOAGULATION CONSULT NOTE - Follow Up Consult  Pharmacy Consult for heparin Indication: chest pain/ACS  Allergies  Allergen Reactions  . Ace Inhibitors Swelling    Tongue swells   . Benadryl [Diphenhydramine]     Unsure of reaction,   . Cephalexin   . Lasix [Furosemide] Other (See Comments)    Adds Fluid  . Nitrofuran Derivatives Other (See Comments)    Unknown  . Nystatin-Triamcinolone   . Prednisone     Rash   . Sulfa Antibiotics Other (See Comments)    Kidney Disease   . Angiotensin Receptor Blockers Rash    Patient Measurements: Height: 5\' 2"  (157.5 cm) Weight: 168 lb 14.4 oz (76.6 kg) IBW/kg (Calculated) : 50.1   Vital Signs: Temp: 98.5 F (36.9 C) (07/29 0358) BP: 106/68 (07/29 0358) Pulse Rate: 63 (07/29 0145)  Labs:  Recent Labs  02-05-2017 2106 02/10/17 0315 02/10/17 0852 02/10/17 1512 02/11/17 0439  HGB 12.8 11.7* 10.7*  --  11.2*  HCT 37.4 34.6* 31.6*  --  34.0*  PLT 134* 96* 148*  --  125*  HEPARINUNFRC  --   --  0.40  --  0.51  CREATININE 1.87* 1.77*  --   --   --   TROPONINI 0.75* 0.57* 2.54* 5.44*  --     Estimated Creatinine Clearance: 23.1 mL/min (A) (by C-G formula based on SCr of 1.77 mg/dL (H)).   Medications:  Scheduled:  . aspirin  325 mg Oral Daily  . calcium-vitamin D  1 tablet Oral q morning - 10a  . colchicine  0.6 mg Oral q morning - 10a  . insulin aspart  0-9 Units Subcutaneous TID WC  . levothyroxine  75 mcg Oral QAC breakfast  . magnesium oxide  200 mg Oral q morning - 10a  . metoprolol tartrate  50 mg Oral BID  . pantoprazole  40 mg Oral Daily    Assessment: 83yo female with chest pain, on heparin.  Heparin level therapeutic this AM on 700 units/hr.  No bleeding noted.    Goal of Therapy:  Heparin level 0.3-0.7 units/ml Monitor platelets by anticoagulation protocol: Yes   Plan:  Continue heparin 700 units/hr Repeat heparin level 6hr to verify therapeutic x 2 Watch for s/s of bleeding  Marisue HumbleKendra Melissa Pulido, B.S.,  PharmD Clinical Pharmacist [facility]

## 2017-02-11 NOTE — Progress Notes (Addendum)
PROGRESS NOTE   Julia Maynard  ZOX:[id number]    DOB: [birth year]    DOA: 25-Feb-2017  PCP: Phyllis Ginger   I have briefly reviewed patients previous medical records in [facility].  Brief Narrative:  83 year old female, lives with her daughter, up to a week ago was able to ambulate from her bed/chair to the bathroom and in the house,PMH of DM 2 with peripheral neuropathy, GERD, gout, HLD, HTN, hypothyroid, chronic diastolic CHF, initially presented to [facility] for complaints of leg swelling (right >left), intermittent chest pain, cough, was seen by PCP at home and advised to come to the ED. In the ED, ongoing chest pain, elevated troponin, transferred to [facility] 7/28 for cardiology evaluation. Treated with course of antibiotics for suspected URTI on 7/3. Follow-up 7/20 concerning for pneumonia and started on amoxicillin. 6 weeks history of ongoing intermittent nonspecific abdominal pain, diarrhea (between 3-5 episodes of loose/watery stools), intermittent nonbloody emesis? Posttussive, decreased oral intake,? Coughing while eating or drinking. Family denies history of dementia but confused over the last couple of weeks. Initial creatinine 1.87. Ruled in for NSTEMI, DCM Ongoing intermittent chest pain, cardiology consulted, IV heparin drip, aspirin and plan cardiac cath 7/30   Assessment & Plan:   Active Problems:   HTN (hypertension)   Hyperlipidemia   Hypothyroid   GERD (gastroesophageal reflux disease)   Chest pain   1. Chest pain/NSTEMI: Intermittent chest pain for weeks prior to admission. EKG with nonspecific changes. Troponins trended up 0.75 > 0.57 > 2.54 >5.44. Cardiology consulted. Did not tolerate NTG/hypotension and not on NTG drip. Heparin drip was briefly stopped by cardiology but resumed after troponin peaked to 5.44. Intermittent chest pain. 2-D echo: LVEF 25-30 percent and wall motion abnormalities consistent with Takatsubo DCM. Continue IV heparin drip, aspirin,  cardiology added Imdur 10 MG 3 times a day, hydralazine 10 MG 3 times a day and metoprolol 12.5 MG twice a day. Discussed with Dr. Eden Emms > plan for cardiac cath 7/30. No clinical or radiological CHF despite elevated BNP.  2. Dilated cardiomyopathy: 2-D echo results as below. As per cardiology, consistent with Takatsubo DCM. Management as above. Cardiology has also added Demadex 10 MG daily. 3. Chronic nausea, vomiting, diarrhea & abdominal pain: As per history by family. Unclear etiology. KUB without acute findings. Has not had any further vomiting or diarrhea in the hospital. Discontinue contact isolation. Diet per speech therapy. Mild intermittent nausea. Low index of suspicion for infectious etiology.? Gastroparesis related to DM. 4. Suspected dysphagia: Speech therapy evaluation appreciated and continued dysphagia 2 diet and thin liquids. 5. Recent pneumonia: Current chest x-ray negative. Pro-calcitonin 1.37. No fever or leukocytosis. Completed 7 days of Augmentin - discontinued. Reported coughing some brown material on 7/29. Follow-up repeat chest x-ray today. 6. Anemia: No bleeding reported. Stable. 7. Thrombocytopenia: Platelet counts fluctuating but not too bad. Monitor closely. 8. Acute on stage III chronic kidney disease: Baseline creatinine probably in the 1.3 range. Presented with creatinine of 1.87. Likely secondary to dehydration and possible ATN related to soft blood pressures from cardiomyopathy. Creatinine has plateaued at 1.7. As per RN, no urinary retention. Check renal ultrasound to rule out hydronephrosis. Cardiology starting Demadex. Follow BMP in a.m. 9. Essential hypertension: Intermittent soft blood pressures. Amlodipine discontinued. Monitor closely. 10. GERD: Change PPI to IV. When necessary antiemetics. 11. Type II DM with peripheral neuropathy/hypoglycemia: Had hypoglycemic episode with CBG 46 on 7/29 morning. Discontinued SSI. Monitor CBGs closely and treat per hypoglycemia  protocol.  A1c 6.7. 12. Hypothyroid: Synthroid. TSH normal. 13. Chronic diastolic CHF: Euvolemic. Diuretics started by cardiology. 14. Acute encephalopathy: Likely due to acute illness complicating possible underlying dementia. No focal deficits. Treat underlying cause and monitor closely. Mental status improved. 15. Right >left leg swelling: Check lower extremity venous Dopplers-pending. Currently on IV heparin drip for cardiac indications.   DVT prophylaxis: On IV heparin anticoagulation for NSTEMI Code Status: Full Family Communication: Discussed in detail with patient's grandson and great grandson at bedside. Disposition: To be determined.   Consultants:  Cardiology   Procedures:  None  Antimicrobials:  None   Subjective: Overnight events noted. Intermittent chest pain. No chest pain currently. One I saw her this morning, she had no complaints. Subsequently RN reported that she had cough with some brown sputum, nausea without vomiting. No diarrhea reported.  ROS: Also report right >left leg swelling.  Objective:  Vitals:   02/11/17 0145 02/11/17 0358 02/11/17 0930 02/11/17 0944  BP: 127/79 106/68 100/64 99/66  Pulse: 63   (!) 58  Resp: 18 18  16   Temp:  98.5 F (36.9 C)    TempSrc:      SpO2: 94% 93%  95%  Weight:  76.6 kg (168 lb 14.4 oz)    Height:        Examination:  General exam: Pleasant elderly female, moderately built, chronically ill-looking, lying comfortably propped up in bed without distress.Oral mucosa with borderline hydration. Respiratory system: Clear to auscultation. Poor inspiratory effort. No change. Cardiovascular system: S1 & S2 heard, RRR. No JVD, murmurs, rubs, gallops or clicks. Trace right leg edema, asymmetric compared to left. Telemetry: Sinus bradycardia in the 50s-sinus rhythm, occasional bigeminy. Gastrointestinal system: Abdomen is nondistended, soft and nontender. No organomegaly or masses felt. Normal bowel sounds heard. Central  nervous system: Alert and oriented to person, place and partly to time. No focal neurological deficits. Extremities: Spontaneously moves all limbs symmetrically. Skin: No rashes, lesions or ulcers Psychiatry: Judgement and insight impaired. Mood & affect flat.    Data Reviewed: I have personally reviewed following labs and imaging studies  CBC:  Recent Labs Lab 2017-03-12 2106 02/10/17 0315 02/10/17 0852 02/11/17 0439  WBC 7.7 8.2 8.2 9.6  NEUTROABS 4.5  --   --   --   HGB 12.8 11.7* 10.7* 11.2*  HCT 37.4 34.6* 31.6* 34.0*  MCV 86.8 85.2 85.4 87.4  PLT 134* 96* 148* 125*   Basic Metabolic Panel:  Recent Labs Lab 2017-03-12 2106 02/10/17 0315 02/11/17 0439  NA 133*  --  133*  K 4.0  --  4.4  CL 102  --  106  CO2 24  --  19*  GLUCOSE 126*  --  57*  BUN 22*  --  21*  CREATININE 1.87* 1.77* 1.76*  CALCIUM 8.6*  --  8.2*   Liver Function Tests: No results for input(s): AST, ALT, ALKPHOS, BILITOT, PROT, ALBUMIN in the last 168 hours. Coagulation Profile: No results for input(s): INR, PROTIME in the last 168 hours. Cardiac Enzymes:  Recent Labs Lab 2017-03-12 2106 02/10/17 0315 02/10/17 0852 02/10/17 1512  TROPONINI 0.75* 0.57* 2.54* 5.44*   HbA1C: No results for input(s): HGBA1C in the last 72 hours. CBG:  Recent Labs Lab 02/10/17 1640 02/10/17 2113 02/11/17 0722 02/11/17 0803 02/11/17 0852  GLUCAP 85 100* 46* 69 90    No results found for this or any previous visit (from the past 240 hour(s)).       Radiology Studies: Dg Chest Portable 1  View  Result Date: 02/10/2017 CLINICAL DATA:  Productive cough and positional chest pain. EXAM: PORTABLE CHEST 1 VIEW COMPARISON:  12/22/2011 FINDINGS: A single AP portable view of the chest demonstrates no focal airspace consolidation or alveolar edema. The lungs are grossly clear. There is no large effusion or pneumothorax. Cardiac and mediastinal contours appear unremarkable. Healed rib fracture deformities on the  right. IMPRESSION: No active disease. Electronically Signed   By: Ellery Plunkaniel R Mitchell M.D.   On: 02/10/2017 02:05   Dg Abd Portable 1v  Result Date: 02/10/2017 CLINICAL DATA:  Nausea vomiting and diarrhea. Rule out fecal impaction. EXAM: PORTABLE ABDOMEN - 1 VIEW COMPARISON:  09/20/2016 FINDINGS: The bowel gas pattern is normal. No radio-opaque calculi or other significant radiographic abnormality are seen. Previous cholecystectomy. IMPRESSION: Negative. Electronically Signed   By: Signa Kellaylor  Stroud M.D.   On: 02/10/2017 09:44        Scheduled Meds: . aspirin  325 mg Oral Daily  . calcium-vitamin D  1 tablet Oral q morning - 10a  . colchicine  0.6 mg Oral q morning - 10a  . hydrALAZINE  10 mg Oral Q8H  . isosorbide dinitrate  10 mg Oral TID  . levothyroxine  75 mcg Oral QAC breakfast  . magnesium oxide  200 mg Oral q morning - 10a  . metoprolol tartrate  12.5 mg Oral BID  . pantoprazole  40 mg Oral Daily  . torsemide  10 mg Oral Daily   Continuous Infusions: . heparin 700 Units/hr (02/10/17 1844)     LOS: 0 days     Jushua Waltman, MD, FACP, FHM. [facility] Pager [phone]  If 7PM-7AM, please contact night-coverage [email or web address] Password [email or web address] 02/11/2017, 10:50 AM

## 2017-02-11 NOTE — Progress Notes (Signed)
Hypoglycemic Event  CBG: 46  Treatment: 15 GM carbohydrate snack  Symptoms: None  Follow-up CBG: Time: 803 CBG Result: 69  Follow-up CBG: Time: 852 CBG Result: 90  Possible Reasons for Event: Inadequate meal intake  Comments/MD notified: Dr. Waymon AmatoHongalgi notified.    Geronimo Bootownes, Tirsa Gail R

## 2017-02-11 NOTE — Progress Notes (Signed)
  Echocardiogram 2D Echocardiogram has been performed.  Julia Maynard 02/11/2017, 8:44 AM

## 2017-02-12 LAB — CBC
HEMATOCRIT: 34 % — AB (ref 36.0–46.0)
Hemoglobin: 10.9 g/dL — ABNORMAL LOW (ref 12.0–15.0)
MCH: 27.8 pg (ref 26.0–34.0)
MCHC: 32.1 g/dL (ref 30.0–36.0)
MCV: 86.7 fL (ref 78.0–100.0)
Platelets: 135 10*3/uL — ABNORMAL LOW (ref 150–400)
RBC: 3.92 MIL/uL (ref 3.87–5.11)
RDW: 15.6 % — ABNORMAL HIGH (ref 11.5–15.5)
WBC: 15.8 10*3/uL — ABNORMAL HIGH (ref 4.0–10.5)

## 2017-02-12 LAB — LACTIC ACID, PLASMA: Lactic Acid, Venous: 3 mmol/L (ref 0.5–1.9)

## 2017-02-12 LAB — OCCULT BLOOD GASTRIC / DUODENUM (SPECIMEN CUP): Occult Blood, Gastric: POSITIVE — AB

## 2017-02-12 LAB — GLUCOSE, CAPILLARY: GLUCOSE-CAPILLARY: 84 mg/dL (ref 65–99)

## 2017-02-12 MED ORDER — NOREPINEPHRINE BITARTRATE 1 MG/ML IV SOLN
0.0000 ug/min | INTRAVENOUS | Status: DC
  Administered 2017-02-12: 20 ug/min via INTRAVENOUS
  Filled 2017-02-12: qty 4

## 2017-02-12 MED ORDER — DOPAMINE-DEXTROSE 3.2-5 MG/ML-% IV SOLN
2.0000 ug/kg/min | INTRAVENOUS | Status: DC
  Administered 2017-02-12: 2.5 ug/kg/min via INTRAVENOUS
  Filled 2017-02-12: qty 250

## 2017-02-12 MED ORDER — DEXTROSE 50 % IV SOLN
INTRAVENOUS | Status: AC
  Filled 2017-02-12: qty 50

## 2017-02-12 MED FILL — Medication: Qty: 1 | Status: AC

## 2017-02-14 ENCOUNTER — Telehealth: Payer: Self-pay | Admitting: Pulmonary Disease

## 2017-02-14 NOTE — Progress Notes (Addendum)
Received rapid response pt and report at bedside, pt restless/agitated eyes open with rapid alteration in loc, skin color dusky cool and dry, pt with tachypnea and labored RR, RT removed oxygen face mask and began bagging pt, elink button pressed for immediate assistance, pulse oximetry unable to register sats, dopamine drip@ 40mcg levo drip@ 30 mcg heparin drip@ 700u/hr. Dr Julio AlmZaaqoq to bedside as nursing staff noted PEA at [phone], ACLS protocol started see code record. Pt family in department updated by md during code. pt without breath sounds pulse or heart sounds confirmed by md via ultrasound and auscultation, TOD at 0247. Family to bedside.

## 2017-02-14 NOTE — Progress Notes (Signed)
Chaplain provided spiritual and grief support during last stages of patient's life and afterward, including prayer and blessing.  Chaplain offered refreshments and gave orientation to procedures.  Family is not ready to declare a funeral home.  Chaplain secured contact information and gave family a patient placement card.  Family is contacting other members.  One grandson from [place] is en route and will be here in approximately 50 minutes.  Daughter, son-in-law and granddaughter have gone to waiting area to wait for him.  Next of kin contact information is daughter:  Julia Maynard [address] [place], [place] [address] [phone]    02/08/2017 0300  Clinical Encounter Type  Visited With Patient and family together  Visit Type Initial;Patient actively dying  Referral From Nurse  Consult/Referral To Chaplain  Spiritual Encounters  Spiritual Needs Prayer  Stress Factors  Family Stress Factors Major life changes   Please call as requested or needed when additional family members arrive.  Debby Budalacios, Verdella Laidlaw [place], [place] [phone]

## 2017-02-14 NOTE — Progress Notes (Signed)
2200: Pt received 2mg  of morphine and become hypotensive, Schorr and Carncelli notified and rapid response called. Schorr ordered for 500mL bolus over 2 hours. Mindy, RN came to bedside and assessed pt. Pt remained AOx2, which is pt's baseline. Carncelli verbalized to run 500 over 1 hour after looking over pt's chart.   2330: Pt remained hypotensive. Dr. Liane Comberarnicelli came to bedside to assess pt (see MD note) and suggested to continue to give pt more fluids.   0030: Pt still hypotensive, but no change in mental status and pt denies any CP, dizziness, or lightheadedness. Dr. Liane Comberarnicelli notified and put in orders for Dopamine gtt and multiple labs. Gtt initiated.  0115: Pt began vomiting black emesis. Dr. Liane Comberarnicelli and Schorr notified. CBC added and Occult blood for gastric content ordered.  0120: Pt mental status rapidly deteriorated, not answering any orientation questions, pulling at all lines, diaphoretic. Rapid response called and Dr. Liane Comberarnicelli and Schoor notified. Dr. Liane Comberarnicelli responded with call back and verbalized pt should be sent to ICU. Schoor responded as well and agreed.   0155: Pt was transferred to ICU after bed placement.

## 2017-02-14 NOTE — Significant Event (Signed)
I have been called bedside for worsening shock and resp status   In summary: 83 yo F with multiple co-morbidities presented to the hospital with cough and swelling found to have increase in troponin due to NSTEMI and AKI.  Evolving Events:  Patient transferred to ICU for worsening shock on dopamine and levophed. On my arrival patient was hypoxic and while we are getting ready for intubation. Patient developed bradycardia and asystole cardiac arrest. ACLS started and patient intubated during the code. We regained spontaneous circulation briefly but she developed V tach cardiac arrest so ACLS resume, patient shocked two time and received amiodarone boluses.  I discussed with the family during the code that giving the patient age, co-morbidities, that her chances of meaningful recovery are extremely low. They decided to make her comfort care if her heart stopped again.   Shortly thereafter, patient developed cardiac arrest, no CPR started and patient pronounced dead on 7/30 2:20am  Family bedside and informed

## 2017-02-14 NOTE — Progress Notes (Signed)
   Took call about patient Julia Maynard from cardiology fellow about patint in 3W enroute to ICU.Patient Julia Laurencella Dake meanwhile arrived in ICU with patient being bagged.  Rapidly deteriorated a CCM MD Dr Reino KentAkram Z to bedside stat and at time of his arrival patient in CPR   Dr. Kalman ShanMurali Thimothy Barretta, M.D., [facility] Psychiatric CenterF.C.C.P Pulmonary and Critical Care Medicine Staff Physician [facility] [place] [facility] Pager: [phone], If no answer or between  15:00h - 7:00h: call [phone]  Nov 27, 2016 2:20 AM

## 2017-02-14 NOTE — Significant Event (Addendum)
Rapid Response Event Note  Overview:Called d/t pt BP-60s and low SpO2. Time Called: 0120 Arrival Time: 0123 Event Type: Respiratory, Cardiac, Hypotension  Initial Focused Assessment: Pt laying in bed, pulling at lines, gown, trying to get OOB (pt seen earlier in shift and was alert and orient X2, pleasantly confused but not pulling at lines or attempting to get OOB). BP-59/49, HR 100, RR-21, SpO2-93% on 5L.  Skin diaphoretic.  Pt having coffee ground emesis. Pt SpO2 continued to decrease despite placing pt on 100% NRB.  Pt BP continued to be low despite maxed out dopamine and Levophed.  Interventions: Bedside RN had started dopamine gtt PTA RRT.  It was initially on 7.225mcg-advised to increase it to 20mcg over phone PTA RRT. At my arrival, Levophed gtt ordered by cardiology and started.  Pt placed on 100% NRB.  Pt transferred to 2M11 where she coded, was intubated, and eventally expired.  Plan of Care (if not transferred):  Event Summary: Name of Physician Notified: Schorr, NP, Liane Comberarnicelli, MD at  (0130)    at    Outcome: (S) Transferred (Comment) [phone](2M11)  Event End Time: 0235  Terrilyn SaverHopper, Ayham Word Anderson

## 2017-02-14 DEATH — deceased

## 2017-02-17 LAB — CULTURE, BLOOD (ROUTINE X 2)
CULTURE: NO GROWTH
Culture: NO GROWTH
SPECIAL REQUESTS: ADEQUATE
SPECIAL REQUESTS: ADEQUATE

## 2017-03-17 NOTE — Death Summary Note (Signed)
DEATH SUMMARY   Patient Details  Name: Julia Maynard MRN: [id number] DOB: 10/05/1931  Admission/Discharge Information   Admit Date:  02/05/2017  Date of Death: Date of Death: Mar 05, 2017  Time of Death: Time of Death: 0247  Length of Stay: 1  Referring Physician: Abran RichardBaucom, Jenny B, PA-C   Reason(s) for Hospitalization    Diagnoses  Preliminary cause of death:  Cardiogenic shock secondary to NSTEMI  Secondary Diagnoses (including complications and co-morbidities):  Active Problems:   HTN (hypertension)   Hyperlipidemia   Hypothyroid   GERD (gastroesophageal reflux disease)   Chest pain   NSTEMI (non-ST elevated myocardial infarction) [facility](HCC)   Brief Hospital Course (including significant findings, care, treatment, and services provided and events leading to death)   83 year old female, lived with her daughter, up to a week pta was able to ambulate from her bed/chair to the bathroom and in the house, PMH of DM 2 with peripheral neuropathy, GERD, gout, HLD, HTN, hypothyroid, chronic diastolic CHF, initially presented to [facility] for complaints of leg swelling (right >left), intermittent chest pain, cough, was seen by PCP at home and advised to come to the ED. In the ED, ongoing chest pain, elevated troponin, [facility] 7/28 for Cardiology evaluation. As out patient, treated with course of antibiotics for suspected URTI on 7/3. Follow-up 7/20 concerning for pneumonia and started on amoxicillin. 6 weeks history of ongoing intermittent nonspecific abdominal pain, diarrhea (between 3-5 episodes of loose/watery stools), intermittent nonbloody emesis? Posttussive, decreased oral intake,? Coughing while eating or drinking. Family denied history of dementia but confused over the last couple of weeks. Initial creatinine 1.87. Ruled in for NSTEMI, DCM (echo showed LVEF 25-30 percent), ongoing intermittent chest pain, Cardiology consulted, IV heparin drip, aspirin and planned diagnostic  cardiac cath 04/13/2017.  On the night of 02/11/17, patient rapidly declined. She developed hypotension that was resistant to IV fluid boluses and was eventually placed on dual vasopressors including dopamine and Levophed and she also developed acute hypoxia. Rapid response, Cardiology and CCM attended to her. She developed bradycardia and asystolic cardiac arrest. ACLS started and patient intubated during the code. She briefly regained spontaneous circulation but developed V. tach cardiac arrest and so ACLS was resumed, patient was shocked 2 and received amiodarone boluses. CCM discussed with family during the code that given the patient's age, comorbidities, that her chances of meaningful recovery was extremely low. Family decided to make her comfort care if her heart stopped again. Shortly thereafter, patient developed cardiac arrest, no CPR started and patient was pronounced dead on 04/13/2017 at 2:47 AM. Family was at bedside and were informed.  Care during patient's hospitalization was as outlined below.   Assessment & Plan:   1. Chest pain/NSTEMI: Intermittent chest pain for weeks prior to admission. EKG with nonspecific changes. Troponins trended up 0.75 > 0.57 > 2.54 >5.44. Cardiology consulted. Did not tolerate NTG/hypotension and not on NTG drip. Heparin drip was briefly stopped by cardiology but resumed after troponin peaked to 5.44. Intermittent chest pain. 2-D echo: LVEF 25-30 percent and wall motion abnormalities consistent with Takatsubo DCM. Continued IV heparin drip, aspirin, cardiology added Imdur 10 MG 3 times a day, hydralazine 10 MG 3 times a day and metoprolol 12.5 MG twice a day. Plans were for cardiac cath 7/30 but patient rapidly declined the night before and died.  2. Dilated cardiomyopathy: 2-D echo results as below. As per cardiology, consistent with Takatsubo DCM. Management as above. Cardiology had added Demadex 10 MG daily.  3. Chronic nausea, vomiting, diarrhea & abdominal  pain: As per history by family. Unclear etiology. KUB without acute findings. Low index of suspicion for infectious etiology.? Gastroparesis related to DM. She had an episode of coffee-ground emesis on the night of her demise. 4. Suspected dysphagia: Speech therapy was consulted and recommended dysphagia 2 diet and thin liquids. 5. Recent pneumonia: Current chest x-ray negative. Pro-calcitonin 1.37. No fever or leukocytosis. Completed 7 days of Augmentin - discontinued. Reported coughing some brown material on 7/29.  6. Anemia:  had been stable. 7. Thrombocytopenia: Platelet counts fluctuating but were not too bad. 8. Acute on stage III chronic kidney disease: Baseline creatinine probably in the 1.3 range. Presented with creatinine of 1.87. Likely secondary to dehydration and possible ATN related to soft blood pressures from cardiomyopathy. Creatinine had plateaued at 1.7. No urinary retention.  9. Essential hypertension:  she developed cardiogenic shock on might optimize. 10. GERD: Treated with PPIs. 11. Type II DM with peripheral neuropathy/hypoglycemia: Had hypoglycemic episode with CBG 46 on 7/29 morning. Discontinued SSI. A1c 6.7. 12. Hypothyroid: Synthroid. TSH normal. 13. Chronic diastolic CHF: Euvolemic. Diuretics started by cardiology. 14. Acute encephalopathy: Likely due to acute illness complicating possible underlying dementia. No focal deficits.  15. Right >left leg swelling: bilateral lower extremity venous Dopplers were negative for DVTs. She had been placed on IV heparin drip.    Consultants:  Cardiology  CCM.  Procedures:  2-D echo 02/11/17: Study Conclusions  - Left ventricle: Mid and distal anterior/inferior wall apical and   septal hypokinesis may be consistant with takatsubo DCM. The   cavity size was severely dilated. There was mild focal basal   hypertrophy of the septum. Systolic function was severely   reduced. The estimated ejection fraction was in the range  of 25%   to 30%. - Mitral valve: There was moderate regurgitation. Valve area by   pressure half-time: 1 cm^2. - Left atrium: The atrium was mildly dilated. - Atrial septum: No defect or patent foramen ovale was identified. - Pulmonary arteries: PA peak pressure: 44 mm Hg (S). - Pericardium, extracardiac: A trivial pericardial effusion was   identified.   Pertinent Labs and Studies  Are as discussed above.  Procedures/Operations     Hasani Diemer 03/16/2017, 12:43 PM

## 2018-05-04 IMAGING — CR DG CHEST 1V PORT
1 series · 1 of 1 positions shown · non-contrast
Comparison: 12/22/2011

CLINICAL DATA: Productive cough and positional chest pain.

EXAM:
PORTABLE CHEST 1 VIEW

[portable]
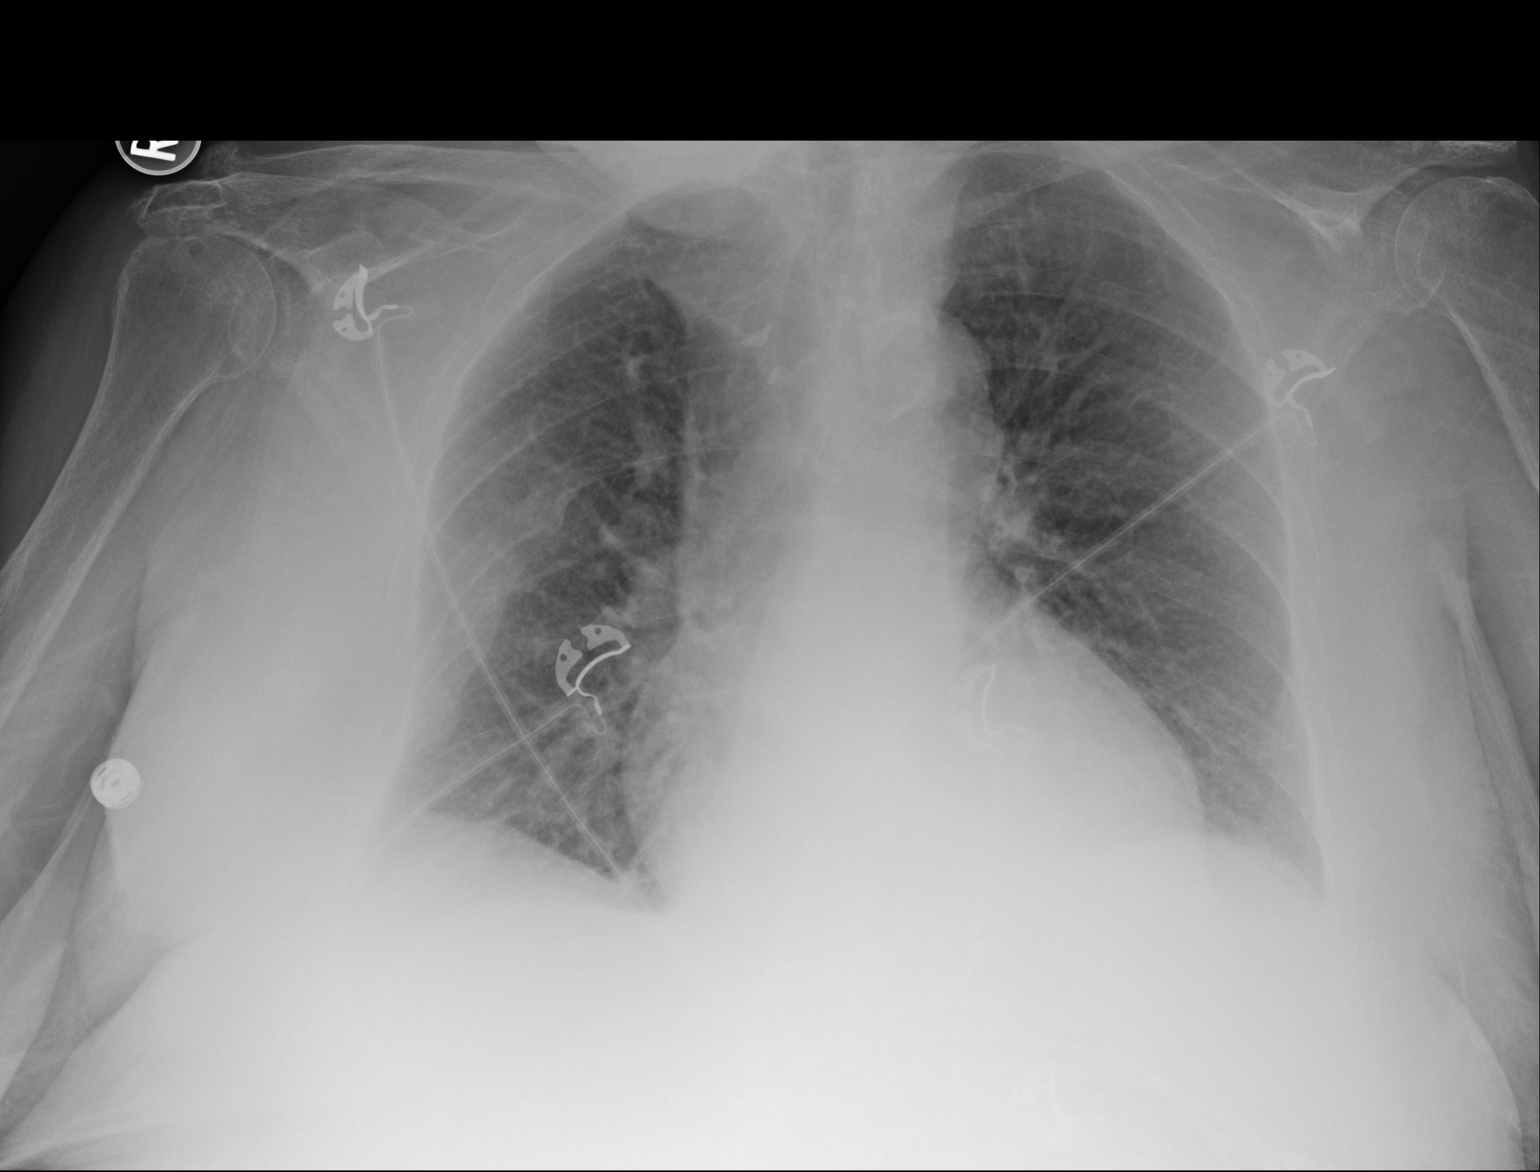

[1 of 1 positions shown; findings below may reference images not displayed]

FINDINGS: A single AP portable view of the chest demonstrates no focal
airspace consolidation or alveolar edema. The lungs are grossly
clear. There is no large effusion or pneumothorax. Cardiac and
mediastinal contours appear unremarkable. Healed rib fracture
deformities on the right.
IMPRESSION: No active disease.
